# Patient Record
Sex: Female | Born: 1945 | Race: White | Hispanic: No | Smoking: Never smoker
Health system: Southern US, Community
[De-identification: ages and names within clinical notes are randomized; demographics above are authoritative.]

## PROBLEM LIST (undated history)

## (undated) DIAGNOSIS — M1990 Unspecified osteoarthritis, unspecified site: Secondary | ICD-10-CM

## (undated) DIAGNOSIS — F329 Major depressive disorder, single episode, unspecified: Secondary | ICD-10-CM

## (undated) DIAGNOSIS — J45909 Unspecified asthma, uncomplicated: Secondary | ICD-10-CM

## (undated) DIAGNOSIS — E049 Nontoxic goiter, unspecified: Secondary | ICD-10-CM

## (undated) DIAGNOSIS — E785 Hyperlipidemia, unspecified: Secondary | ICD-10-CM

## (undated) DIAGNOSIS — F32A Depression, unspecified: Secondary | ICD-10-CM

## (undated) DIAGNOSIS — I499 Cardiac arrhythmia, unspecified: Secondary | ICD-10-CM

---

## 1989-04-03 HISTORY — PX: BREAST SURGERY: SHX581

## 1998-10-11 ENCOUNTER — Other Ambulatory Visit: Admission: RE | Admit: 1998-10-11 | Discharge: 1998-10-11 | Payer: Self-pay | Admitting: Obstetrics and Gynecology

## 2001-11-29 ENCOUNTER — Other Ambulatory Visit: Admission: RE | Admit: 2001-11-29 | Discharge: 2001-11-29 | Payer: Self-pay | Admitting: Family Medicine

## 2003-05-01 ENCOUNTER — Ambulatory Visit (HOSPITAL_COMMUNITY): Admission: RE | Admit: 2003-05-01 | Discharge: 2003-05-01 | Payer: Self-pay | Admitting: Gastroenterology

## 2004-04-19 ENCOUNTER — Ambulatory Visit: Payer: Self-pay | Admitting: Internal Medicine

## 2004-05-02 ENCOUNTER — Ambulatory Visit: Payer: Self-pay | Admitting: Internal Medicine

## 2004-05-20 ENCOUNTER — Ambulatory Visit: Payer: Self-pay | Admitting: Internal Medicine

## 2004-06-03 ENCOUNTER — Ambulatory Visit: Payer: Self-pay | Admitting: Internal Medicine

## 2004-06-17 ENCOUNTER — Ambulatory Visit: Payer: Self-pay | Admitting: Internal Medicine

## 2004-06-21 ENCOUNTER — Ambulatory Visit: Payer: Self-pay | Admitting: Internal Medicine

## 2004-07-01 ENCOUNTER — Ambulatory Visit: Payer: Self-pay | Admitting: Internal Medicine

## 2004-07-08 ENCOUNTER — Ambulatory Visit: Payer: Self-pay | Admitting: Internal Medicine

## 2004-07-22 ENCOUNTER — Ambulatory Visit: Payer: Self-pay | Admitting: Internal Medicine

## 2004-08-05 ENCOUNTER — Ambulatory Visit: Payer: Self-pay | Admitting: Internal Medicine

## 2004-08-12 ENCOUNTER — Ambulatory Visit: Payer: Self-pay | Admitting: Internal Medicine

## 2004-08-26 ENCOUNTER — Ambulatory Visit: Payer: Self-pay | Admitting: Internal Medicine

## 2004-09-02 ENCOUNTER — Ambulatory Visit: Payer: Self-pay | Admitting: Internal Medicine

## 2004-09-09 ENCOUNTER — Ambulatory Visit: Payer: Self-pay | Admitting: Internal Medicine

## 2004-09-22 ENCOUNTER — Ambulatory Visit: Payer: Self-pay | Admitting: Internal Medicine

## 2004-09-30 ENCOUNTER — Ambulatory Visit: Payer: Self-pay | Admitting: Internal Medicine

## 2004-10-18 ENCOUNTER — Ambulatory Visit: Payer: Self-pay | Admitting: Internal Medicine

## 2004-10-28 ENCOUNTER — Ambulatory Visit: Payer: Self-pay | Admitting: Internal Medicine

## 2004-11-11 ENCOUNTER — Ambulatory Visit: Payer: Self-pay | Admitting: Internal Medicine

## 2004-11-29 ENCOUNTER — Ambulatory Visit: Payer: Self-pay | Admitting: Internal Medicine

## 2004-12-09 ENCOUNTER — Ambulatory Visit: Payer: Self-pay | Admitting: Internal Medicine

## 2004-12-21 ENCOUNTER — Ambulatory Visit: Payer: Self-pay | Admitting: Internal Medicine

## 2005-05-03 ENCOUNTER — Other Ambulatory Visit: Admission: RE | Admit: 2005-05-03 | Discharge: 2005-05-03 | Payer: Self-pay | Admitting: Family Medicine

## 2005-09-07 ENCOUNTER — Ambulatory Visit: Payer: Self-pay | Admitting: Internal Medicine

## 2007-03-11 ENCOUNTER — Ambulatory Visit: Payer: Self-pay | Admitting: Internal Medicine

## 2007-03-11 DIAGNOSIS — J329 Chronic sinusitis, unspecified: Secondary | ICD-10-CM | POA: Insufficient documentation

## 2007-03-11 DIAGNOSIS — J309 Allergic rhinitis, unspecified: Secondary | ICD-10-CM | POA: Insufficient documentation

## 2007-03-11 DIAGNOSIS — J45909 Unspecified asthma, uncomplicated: Secondary | ICD-10-CM | POA: Insufficient documentation

## 2007-03-18 ENCOUNTER — Encounter: Payer: Self-pay | Admitting: Internal Medicine

## 2010-06-09 ENCOUNTER — Other Ambulatory Visit: Payer: Self-pay | Admitting: Family Medicine

## 2010-06-09 DIAGNOSIS — E049 Nontoxic goiter, unspecified: Secondary | ICD-10-CM

## 2010-06-28 ENCOUNTER — Ambulatory Visit
Admission: RE | Admit: 2010-06-28 | Discharge: 2010-06-28 | Disposition: A | Discharge status: Home / Self Care | Payer: BC Managed Care – PPO | Source: Ambulatory Visit | Attending: Family Medicine | Admitting: Family Medicine

## 2010-06-28 DIAGNOSIS — E049 Nontoxic goiter, unspecified: Secondary | ICD-10-CM

## 2010-07-06 ENCOUNTER — Other Ambulatory Visit: Payer: Self-pay | Admitting: Family Medicine

## 2010-07-06 DIAGNOSIS — I6529 Occlusion and stenosis of unspecified carotid artery: Secondary | ICD-10-CM

## 2010-07-12 ENCOUNTER — Ambulatory Visit
Admission: RE | Admit: 2010-07-12 | Discharge: 2010-07-12 | Disposition: A | Discharge status: Home / Self Care | Payer: BC Managed Care – PPO | Source: Ambulatory Visit | Attending: Family Medicine | Admitting: Family Medicine

## 2010-07-12 DIAGNOSIS — I6529 Occlusion and stenosis of unspecified carotid artery: Secondary | ICD-10-CM

## 2010-08-08 ENCOUNTER — Other Ambulatory Visit: Payer: Self-pay | Admitting: Family Medicine

## 2010-08-08 DIAGNOSIS — E042 Nontoxic multinodular goiter: Secondary | ICD-10-CM

## 2010-08-16 ENCOUNTER — Other Ambulatory Visit: Payer: Self-pay | Admitting: Diagnostic Radiology

## 2010-08-16 ENCOUNTER — Other Ambulatory Visit (HOSPITAL_COMMUNITY)
Admission: RE | Admit: 2010-08-16 | Discharge: 2010-08-16 | Disposition: A | Discharge status: Home / Self Care | Payer: BC Managed Care – PPO | Source: Ambulatory Visit | Attending: Diagnostic Radiology | Admitting: Diagnostic Radiology

## 2010-08-16 ENCOUNTER — Ambulatory Visit
Admission: RE | Admit: 2010-08-16 | Discharge: 2010-08-16 | Disposition: A | Discharge status: Home / Self Care | Payer: BC Managed Care – PPO | Source: Ambulatory Visit | Attending: Family Medicine | Admitting: Family Medicine

## 2010-08-16 DIAGNOSIS — E042 Nontoxic multinodular goiter: Secondary | ICD-10-CM

## 2010-08-16 DIAGNOSIS — R220 Localized swelling, mass and lump, head: Secondary | ICD-10-CM | POA: Insufficient documentation

## 2010-08-16 DIAGNOSIS — R221 Localized swelling, mass and lump, neck: Secondary | ICD-10-CM | POA: Insufficient documentation

## 2010-08-19 NOTE — Op Note (Signed)
NAME:  Heather Fitzpatrick, ARIES                     ACCOUNT NO.:  [id number]   MEDICAL RECORD NO.:  [id number]                   PATIENT TYPE:  AMB   LOCATION:  ENDO                                 FACILITY:  [facility]   PHYSICIAN:  Anselmo Rod, M.D.               DATE OF BIRTH:  Jun 26, 1945   DATE OF PROCEDURE:  05/01/2003  DATE OF DISCHARGE:                                 OPERATIVE REPORT   PROCEDURE:  Screening colonoscopy.   ENDOSCOPIST:  Anselmo Rod, M.D.   INSTRUMENT USED:  Olympus video colonoscope.   INDICATIONS FOR PROCEDURE:  A 65 year old white female with a history of  occasional bright red blood per rectum undergoing screening colonoscopy to  rule out colonic polyps, masses, etc.   PREPROCEDURE PREPARATION:  Informed consent was obtained from the patient  and the patient was  fasted for 8 hours prior to  the procedure and prepped  with a bottle of magnesium citrate and a gallon of GoLYTELY the  night prior  to the procedure.   PREPROCEDURE PHYSICAL:  The patient had stable vital signs. Neck supple.  Chest clear to auscultation, S1, S2 regular. Abdomen soft with normoactive  bowel sounds.   DESCRIPTION OF PROCEDURE:  The patient was placed in the left lateral  decubitus position and sedated with 60 mg of Demerol and 6 mg of Versed in  slow incremental doses. Once sedation was adequate, the patient was  maintained on low flow oxygen and continuous cardiac monitoring.   The Olympus video colonoscope was advanced into the rectum and terminal  ileum without difficulty. No masses, polyps, erosions or ulcerations or  diverticula were seen. A small internal hemorrhoid was recognized on  retroflexion in the rectum. The patient tolerated the procedure well without  complications.   IMPRESSION:  Normal colonoscopy to the terminal ileum except for small  internal hemorrhoids.   RECOMMENDATIONS:  1. Continue a high fiber diet with liberal fluid intake.  2. Repeat  colorectal cancer screening in the next 10 years unless the     patient develops any abnormal symptoms in the interim.  3. Outpatient follow up as the need arises in the future.                                               Anselmo Rod, M.D.    JNM/MEDQ  D:  05/01/2003  T:  05/01/2003  Job:  [id number]   cc:   Talmadge Coventry, M.D.  [address]  [place]  [place] [address]  Fax: [phone]

## 2011-01-23 ENCOUNTER — Other Ambulatory Visit: Payer: Self-pay | Admitting: Obstetrics and Gynecology

## 2011-10-10 ENCOUNTER — Other Ambulatory Visit: Payer: Self-pay | Admitting: Endocrinology

## 2011-10-10 DIAGNOSIS — E049 Nontoxic goiter, unspecified: Secondary | ICD-10-CM

## 2011-10-23 ENCOUNTER — Other Ambulatory Visit: Payer: BC Managed Care – PPO

## 2011-10-29 IMAGING — US US THYROID BIOPSY
1 series · 13 of 19 positions shown · non-contrast
Comparison: Ultrasound from 06/28/2010

CLINICAL DATA: 64-year-old with prominent isthmus nodule.

ULTRASOUND GUIDED NEEDLE ASPIRATE BIOPSY OF THE THYROID GLAND x2

[Series 1: us thyroid biopsy · 0.07mm/px · 19 acquisitions, 13 frames shown]
[im 1/19]
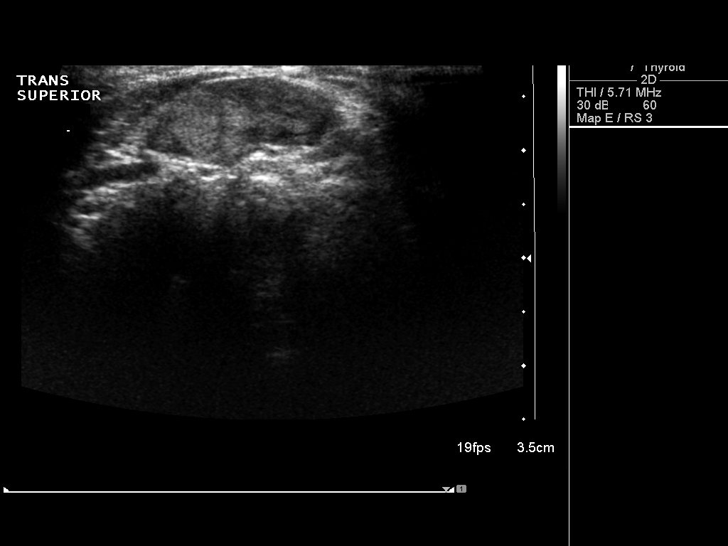
[im 3/19]
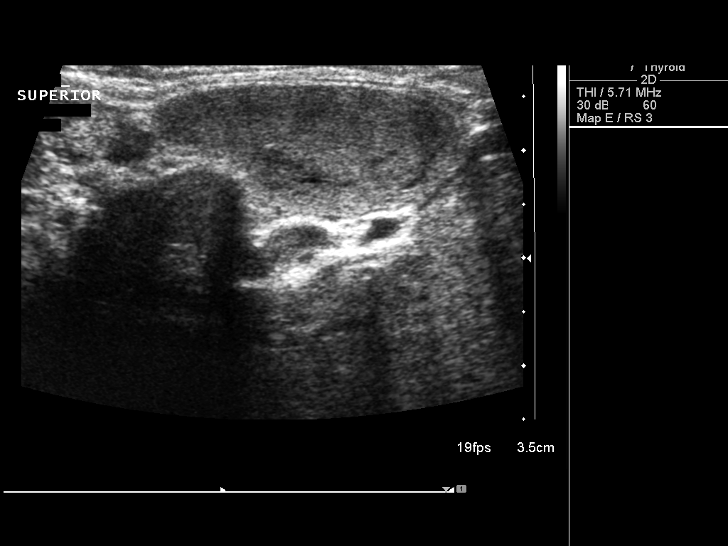
[im 4/19]
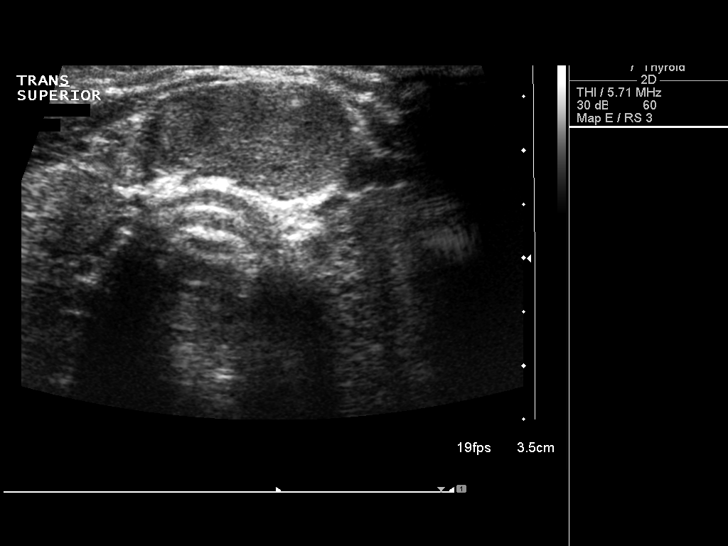
[im 6/19]
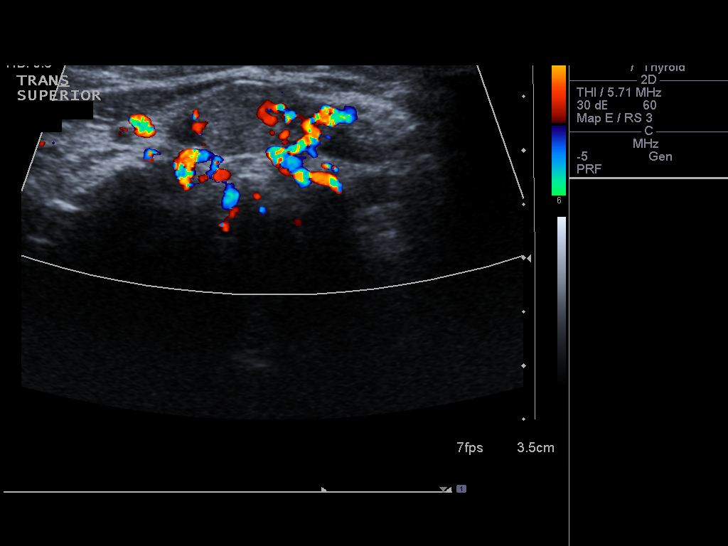
[im 7/19]
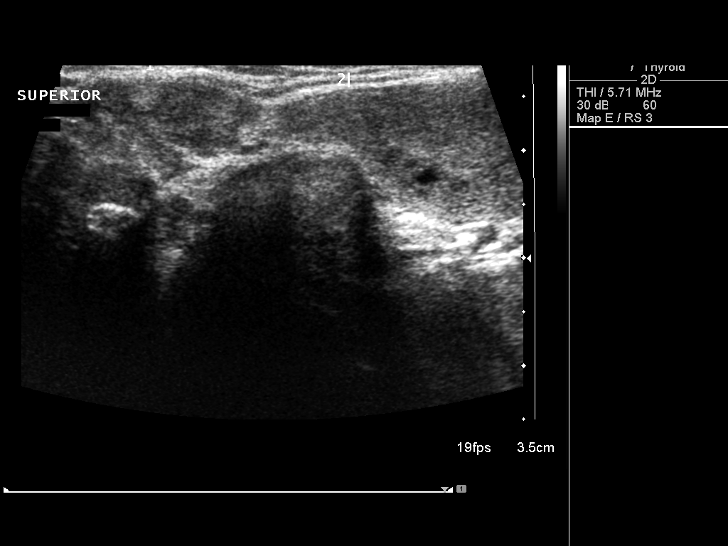
[im 9/19]
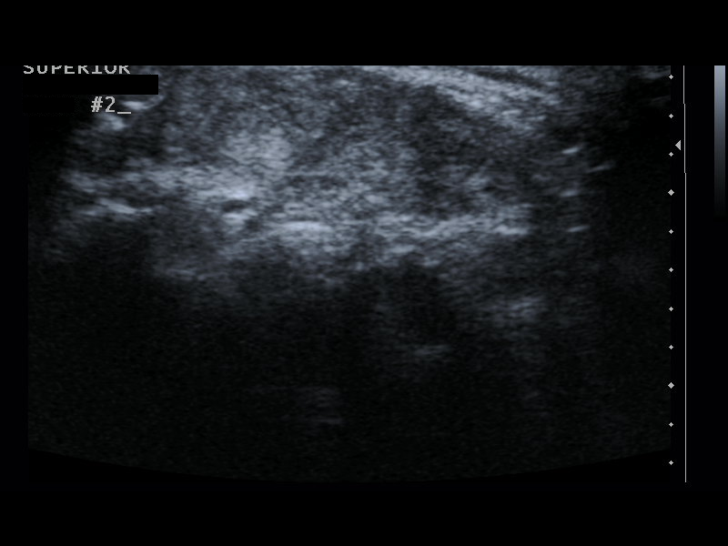
[im 10/19]
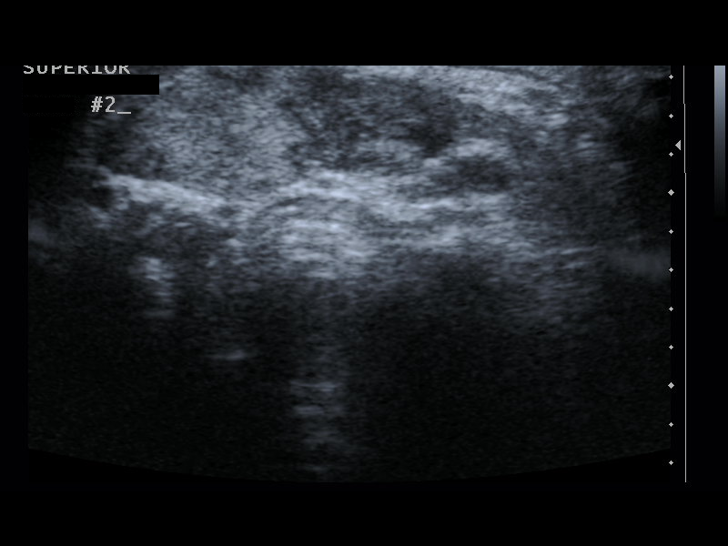
[im 11/19]
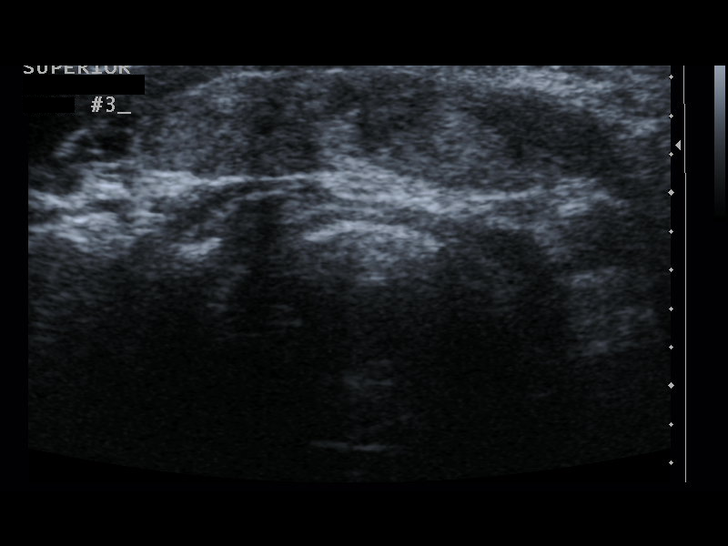
[im 13/19]
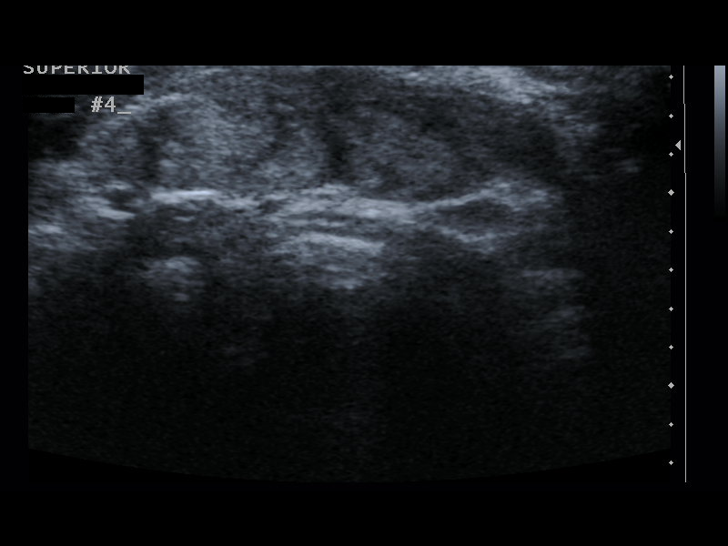
[im 14/19]
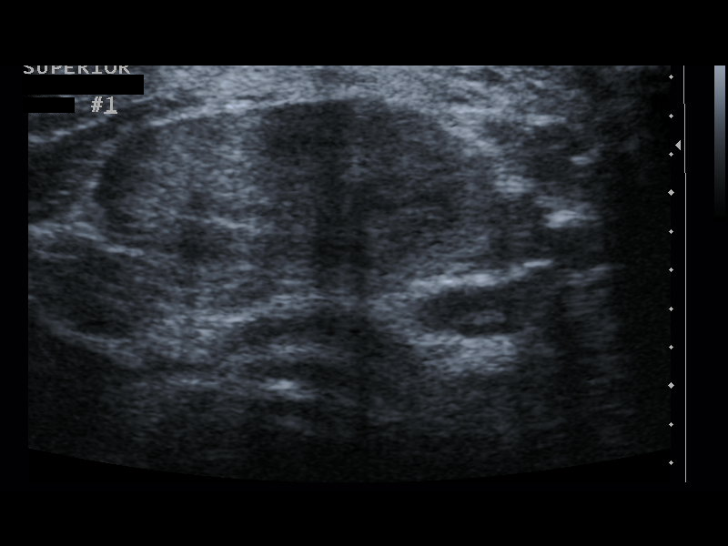
[im 16/19]
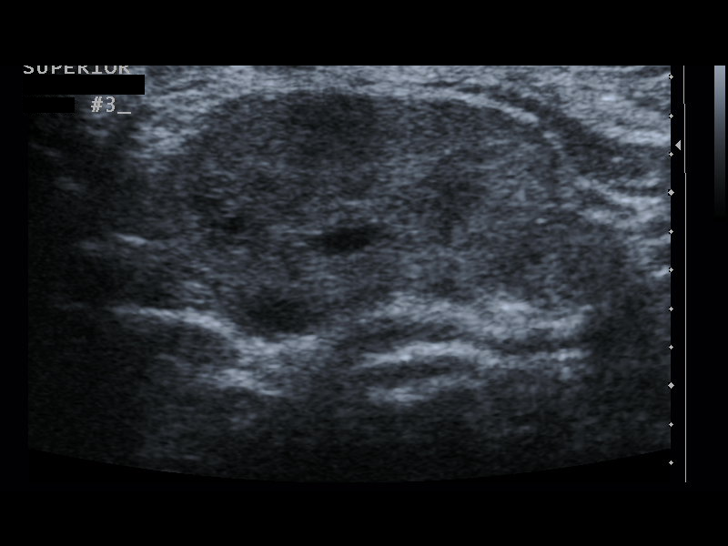
[im 17/19]
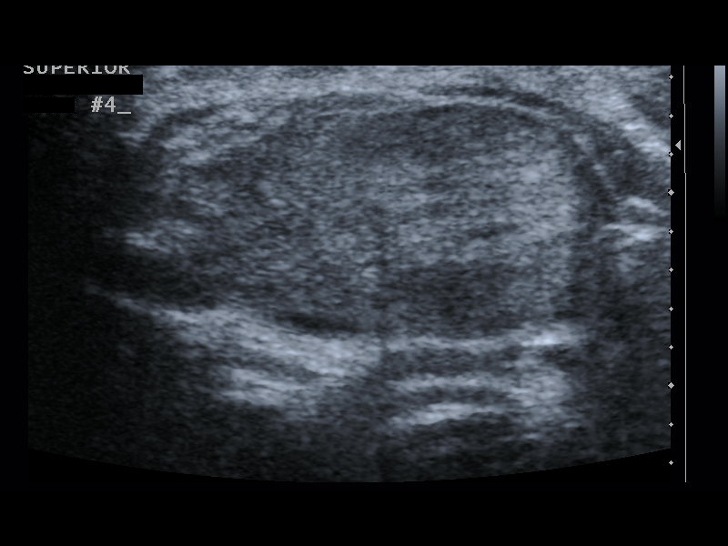
[im 19/19]
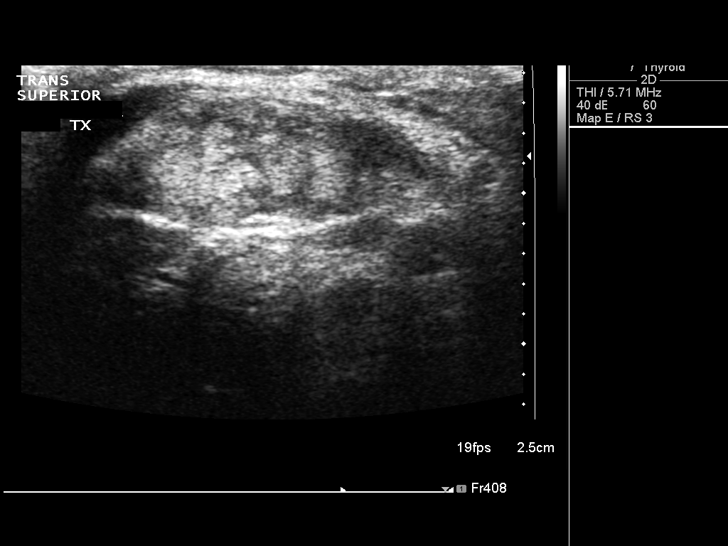

[13 of 19 positions shown; findings below may reference images not displayed]

Thyroid biopsy was thoroughly discussed with the patient and
questions were answered.  The benefits, risks, alternatives, and
complications were also discussed.  The patient understands and
wishes to proceed with the procedure.  Written consent was
obtained.

Ultrasound was performed to localize and mark an adequate site for
the biopsy.  The patient was then prepped and draped in a normal
sterile fashion.  Local anesthesia was provided with 1% lidocaine.
Using direct ultrasound guidance, four passes were made using 25
gauge needles into the nodule within the superior isthmus lobe of
the thyroid.  Ultrasound was used to confirm needle placements on
all occasions.  Specimens were sent to Pathology for analysis.

Using direct ultrasound guidance, four passes were made using 25
gauge needles into the nodule within the isthmus lobe of the
thyroid.  Ultrasound was used to confirm needle placements on all
occasions.  Specimens were sent to Pathology for analysis.

Complications:  None
FINDINGS: The patient has prominent thyroid tissue above the
isthmus.  These islands of tissue may be connected with the
isthmus.  The most superior lesion is smaller and and slightly more
heterogeneous.
IMPRESSION: Ultrasound guided needle aspirate biopsy performed of the two
lesions along the superior aspect of the isthmus.

## 2011-10-30 ENCOUNTER — Other Ambulatory Visit: Payer: BC Managed Care – PPO

## 2011-11-06 ENCOUNTER — Ambulatory Visit
Admission: RE | Admit: 2011-11-06 | Discharge: 2011-11-06 | Disposition: A | Discharge status: Home / Self Care | Payer: BC Managed Care – PPO | Source: Ambulatory Visit | Attending: Endocrinology | Admitting: Endocrinology

## 2011-11-06 DIAGNOSIS — E049 Nontoxic goiter, unspecified: Secondary | ICD-10-CM

## 2013-01-18 IMAGING — US US SOFT TISSUE HEAD/NECK
1 series · 13 of 25 positions shown · non-contrast
Comparison: 06/28/2010 study.

CLINICAL DATA: History of goiter.  History of nodules in the
isthmus.

THYROID ULTRASOUND
TECHNIQUE: Ultrasound examination of the thyroid gland and adjacent
soft tissues was performed.

[Series 1: us soft tissue head/neck · 0.10mm/px · 13 of 39 slices shown]
[im 1/39]
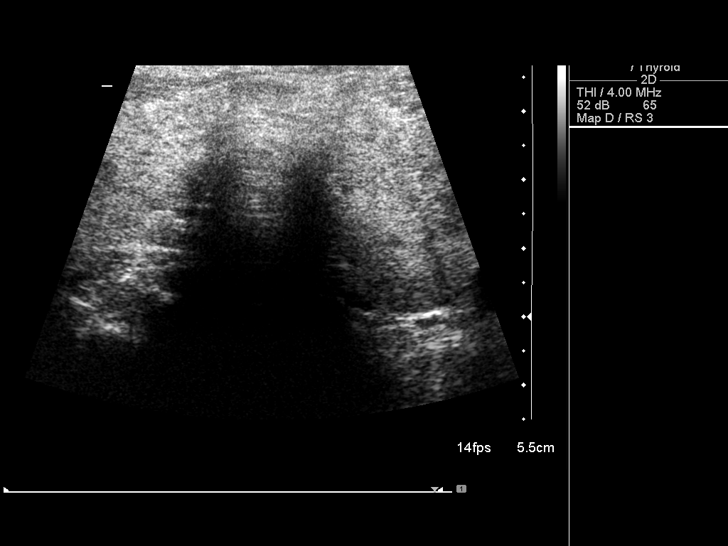
[im 4/39]
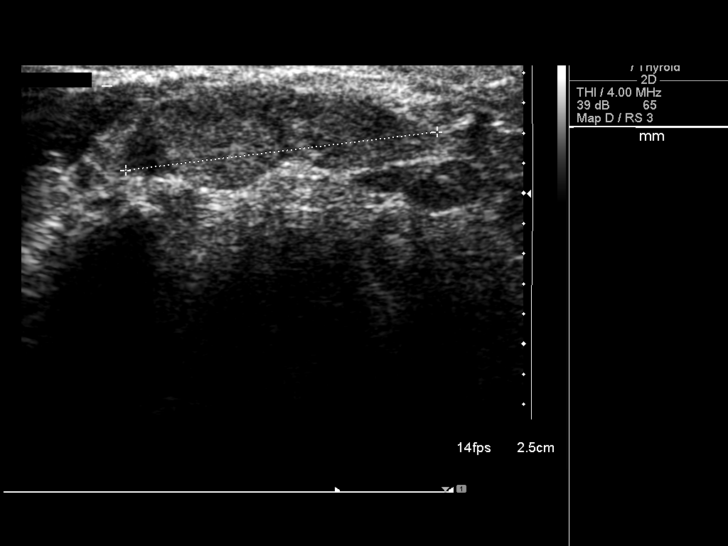
[im 7/39]
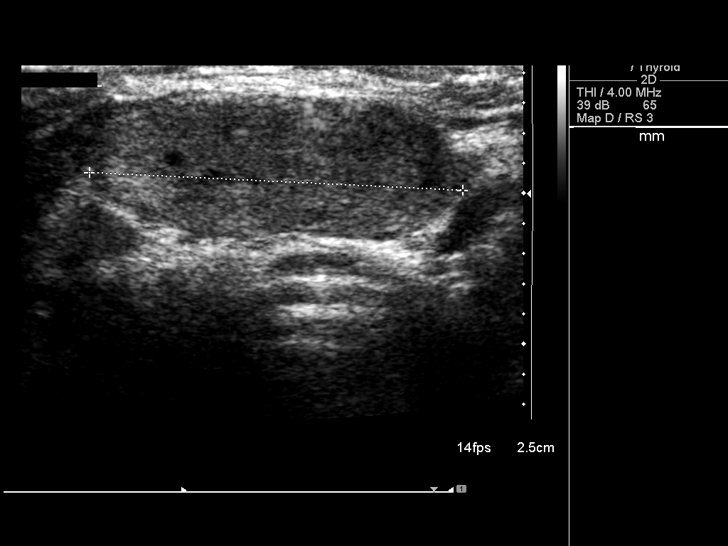
[im 10/39]
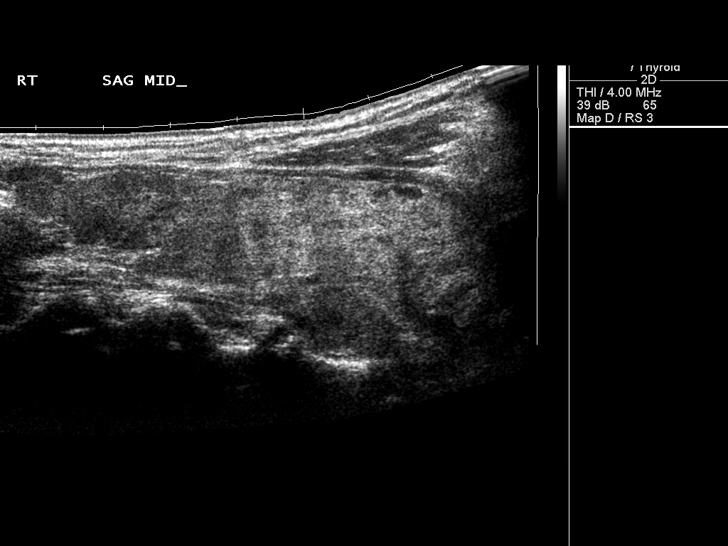
[im 13/39]
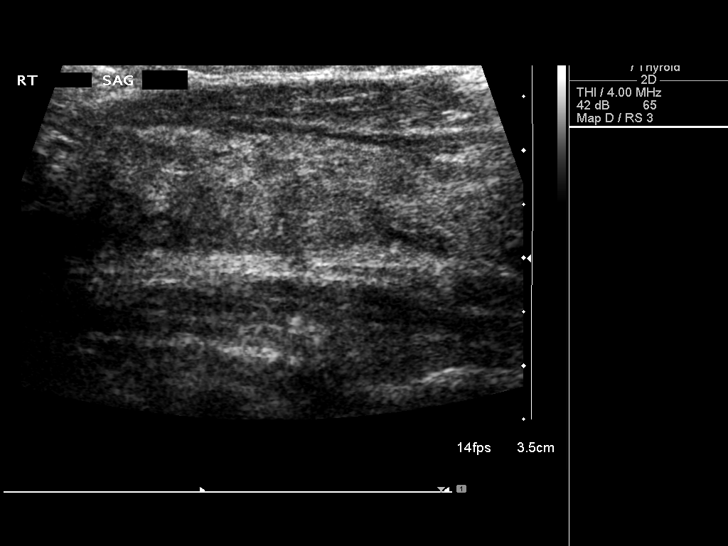
[im 16/39]
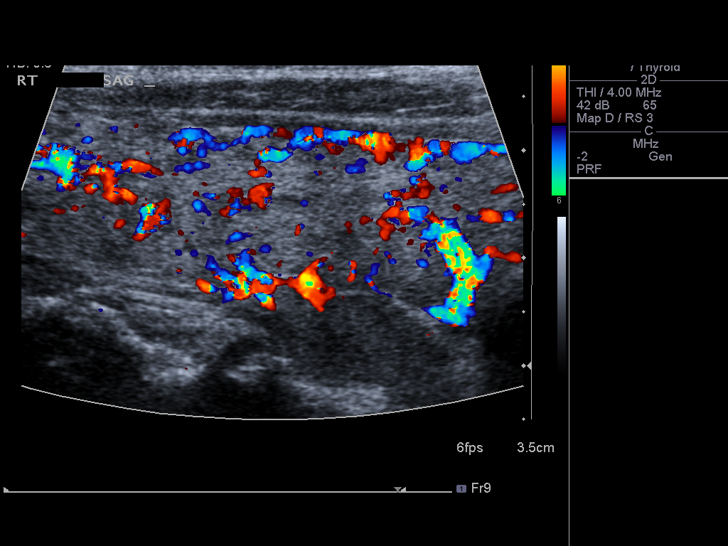
[im 20/39]
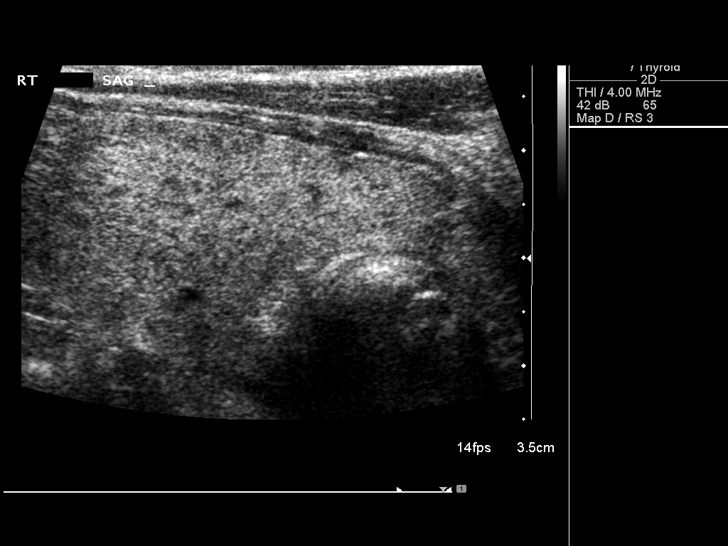
[im 23/39]
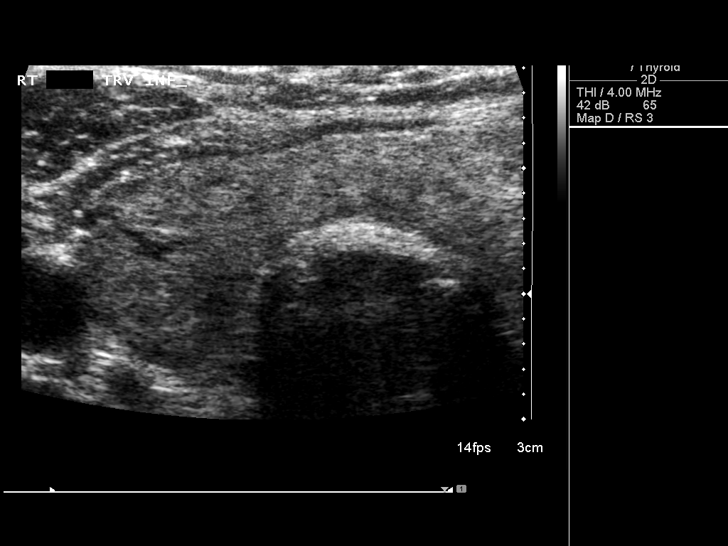
[im 26/39]
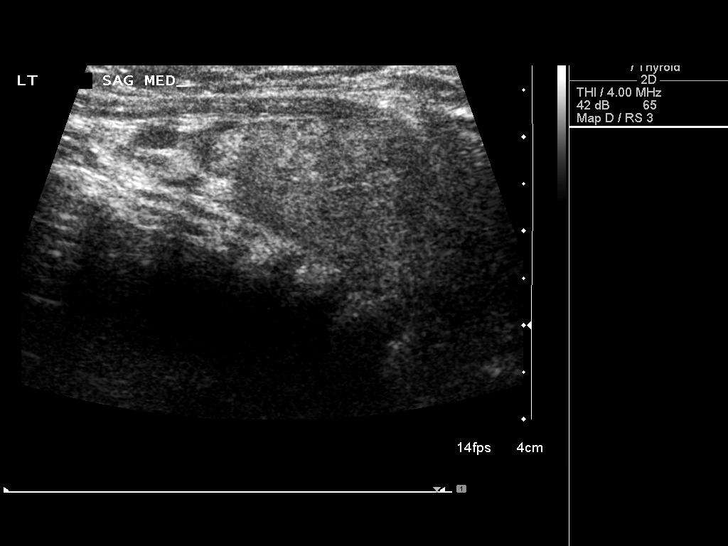
[im 29/39]
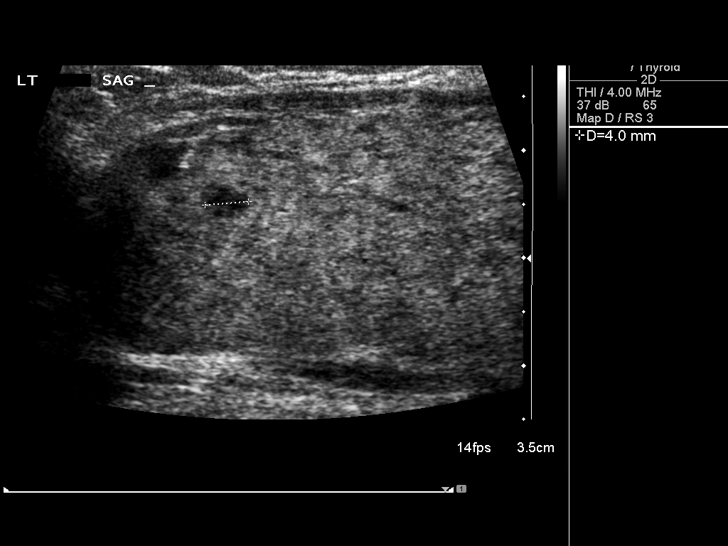
[im 32/39]
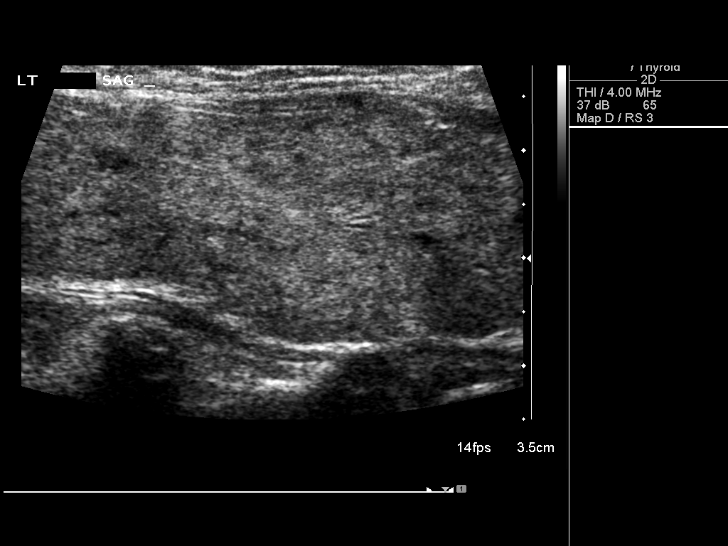
[im 35/39]
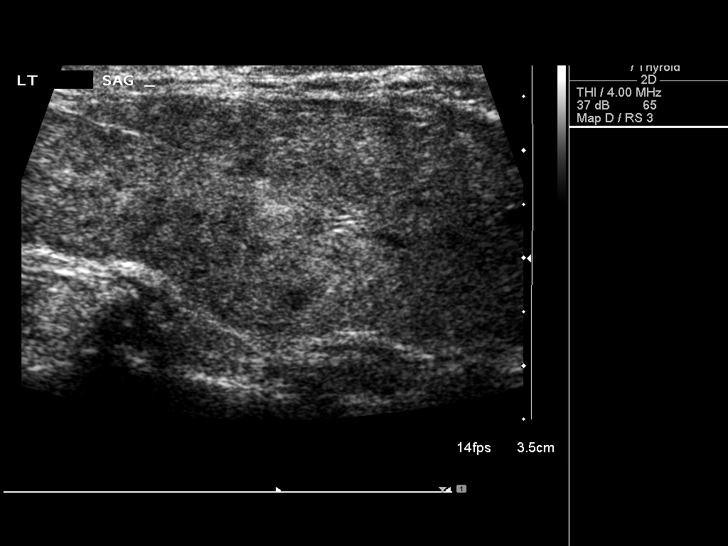
[im 39/39]
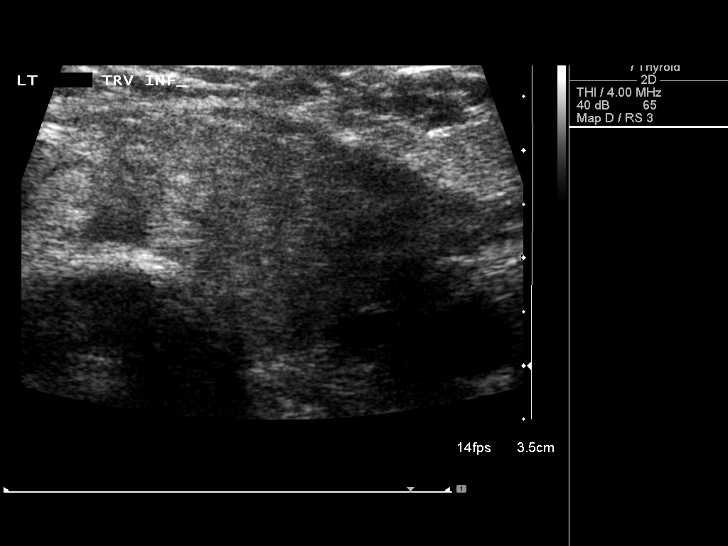

[13 of 25 positions shown; findings below may reference images not displayed]

FINDINGS: Right thyroid lobe:  Right lobe of the thyroid measured 7.7 x 2.9 x
2.3 cm. Volume is 25.7 ml. Parenchyma is inhomogeneous. On previous
study right lobe measured 7.8 x 2.8 x 2.3 cm.  On previous study
volume was 25.1 ml. This is not significantly changed.

Left thyroid lobe:  The left lobe of the thyroid measures 6.8 x
x 2.8 cm. Volume is 25.7 ml. Parenchyma is inhomogeneous. On
previous study left lobe of thyroid measures 6.8 x 2.6 x 2.9 cm. On
previous study volume was 25.6 ml. This is not significantly
changed.

Isthmus:  AP diameter of the isthmus is 1.0 cm. Parenchyma is
inhomogeneous. On previous study AP diameter of the isthmus was
cm.  This is not significantly changed.

Focal nodules:  Nodular area in superior aspect of the isthmus
measured 2.1 x 1.9 x 0.8 cm. Volume is 1.6 ml. On previous study
this area measured 1.7 x 0.9 x 1.7 cm. Volume is 1.3 ml. The nodule
is slightly larger on the current study..

Nodular area in the inferior aspect of the isthmus measures 2.7 x
1.1 x 2.5 cm. Volume measures 3.7 ml.  On previous study this area
measured 2.7 x 1.0 x 2.1 cm. Volume is 2.8 ml.  The nodule is
slightly larger.

Hypoechoic area within the upper midportion of left lobe of thyroid
measured 4 mm in diameter. This probably reflects a small cyst with
a few internal echoes.

Hypoechoic areain the midportion of the left lobe of the thyroid
measured 4 mm in greatest diameter.  This is felt to represent a
small cyst.

Lymphadenopathy:  None visualized.
IMPRESSION: There is a multinodular goiter.  The overall size of the thyroid
gland appears stable. There are two solid nodular areas present
within the isthmus. They are each very slightly larger than on the
previous study.  Previous biopsy has been performed.  Two small
left lobe cysts are present.

## 2014-02-12 ENCOUNTER — Ambulatory Visit (INDEPENDENT_AMBULATORY_CARE_PROVIDER_SITE_OTHER): Payer: 59 | Admitting: Psychology

## 2014-02-12 DIAGNOSIS — F4323 Adjustment disorder with mixed anxiety and depressed mood: Secondary | ICD-10-CM

## 2014-03-05 ENCOUNTER — Ambulatory Visit (INDEPENDENT_AMBULATORY_CARE_PROVIDER_SITE_OTHER): Payer: 59 | Admitting: Psychology

## 2014-03-05 DIAGNOSIS — F4323 Adjustment disorder with mixed anxiety and depressed mood: Secondary | ICD-10-CM

## 2014-03-19 ENCOUNTER — Ambulatory Visit (INDEPENDENT_AMBULATORY_CARE_PROVIDER_SITE_OTHER): Payer: 59 | Admitting: Psychology

## 2014-03-19 DIAGNOSIS — F4323 Adjustment disorder with mixed anxiety and depressed mood: Secondary | ICD-10-CM

## 2014-04-16 ENCOUNTER — Ambulatory Visit: Payer: 59 | Admitting: Psychology

## 2014-05-07 ENCOUNTER — Ambulatory Visit (INDEPENDENT_AMBULATORY_CARE_PROVIDER_SITE_OTHER): Payer: 59 | Admitting: Psychology

## 2014-05-07 DIAGNOSIS — F4323 Adjustment disorder with mixed anxiety and depressed mood: Secondary | ICD-10-CM

## 2014-06-02 ENCOUNTER — Ambulatory Visit (INDEPENDENT_AMBULATORY_CARE_PROVIDER_SITE_OTHER): Payer: 59 | Admitting: Psychology

## 2014-06-02 DIAGNOSIS — F4323 Adjustment disorder with mixed anxiety and depressed mood: Secondary | ICD-10-CM

## 2014-06-18 ENCOUNTER — Ambulatory Visit: Payer: 59 | Admitting: Psychology

## 2016-12-01 ENCOUNTER — Ambulatory Visit: Payer: Self-pay | Admitting: Orthopedic Surgery

## 2016-12-27 NOTE — H&P (Signed)
TOTAL KNEE ADMISSION H&P  Patient is being admitted for left total knee arthroplasty.  Subjective:  Chief Complaint:left knee pain.  HPI: Heather Fitzpatrick, 71 y.o. female, has a history of pain and functional disability in the left knee due to arthritis and has failed non-surgical conservative treatments for greater than 12 weeks to includeNSAID's and/or analgesics, corticosteriod injections, viscosupplementation injections, weight reduction as appropriate and activity modification.  Onset of symptoms was gradual, starting 3 years ago with gradually worsening course since that time. The patient noted no past surgery on the left knee(s).  Patient currently rates pain in the left knee(s) at 7 out of 10 with activity. Patient has worsening of pain with activity and weight bearing, pain that interferes with activities of daily living and pain with passive range of motion.  Patient has evidence of subchondral sclerosis, joint subluxation and joint space narrowing by imaging studies. 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 There is no active infection.  Patient Active Problem List   Diagnosis Date Noted  . SINUSITIS 03/11/2007  . ALLERGIC RHINITIS 03/11/2007  . ASTHMA 03/11/2007   No past medical history on file.  No past surgical history on file.  No prescriptions prior to admission.   Allergies not on file  Social History  Substance Use Topics  . Smoking status: Not on file  . Smokeless tobacco: Not on file  . Alcohol use Not on file    No family history on file.   Review of Systems  Constitutional: Negative.   HENT: Negative.   Eyes: Negative.   Respiratory: Negative.   Cardiovascular: Negative.   Gastrointestinal: Negative.   Genitourinary: Negative.   Musculoskeletal: Positive for joint pain.  Skin: Negative.   Neurological: Negative.   Endo/Heme/Allergies: Negative.   Psychiatric/Behavioral: Negative.     Objective:  Physical  Exam  Constitutional: She is oriented to person, place, and time. She appears well-developed.  HENT:  Right Ear: External ear normal.  Eyes: EOM are normal.  Neck: Normal range of motion.  Cardiovascular: Intact distal pulses.   Respiratory: Effort normal and breath sounds normal.  GI: Soft.  Genitourinary:  Genitourinary Comments: deferred  Musculoskeletal: Normal range of motion.  Neurological: She is alert and oriented to person, place, and time.  Skin: Skin is warm and dry.  Psychiatric: Her behavior is normal.    Vital signs in last 24 hours: BP: ()/()  Arterial Line BP: ()/()   Labs:   There is no height or weight on file to calculate BMI.   Imaging Review Plain radiographs demonstrate moderate degenerative joint disease of the left knee(s). The overall alignment ismild varus. The bone quality appears to be good for age and reported activity level.  Assessment/Plan:  End stage arthritis, left knee   The patient history, physical examination, clinical judgment of the provider and imaging studies are consistent with end stage degenerative joint disease of the left knee(s) and total knee arthroplasty is deemed medically necessary. The treatment options including medical management, injection therapy arthroscopy and arthroplasty were discussed at length. The risks and benefits of total knee arthroplasty were presented and reviewed. The risks due to aseptic loosening, infection, stiffness, patella tracking problems, thromboembolic complications and other imponderables were discussed. The patient acknowledged the explanation, agreed to proceed with the plan and consent was signed. Patient is being admitted for inpatient treatment for surgery, pain control, PT, OT, prophylactic antibiotics, VTE prophylaxis, progressive ambulation and ADL's and discharge planning. The  patient is planning to be discharged to skilled nursing facility   Will use IV tranexamic acid. Contraindications  and adverse affects of Tranexamic acid discussed in detail. Patient denies any of these at this time and understands the risks and benefits.

## 2017-01-10 ENCOUNTER — Other Ambulatory Visit (HOSPITAL_COMMUNITY): Payer: Self-pay | Admitting: Emergency Medicine

## 2017-01-10 NOTE — Patient Instructions (Signed)
Heather Fitzpatrick  01/10/2017   YourGRISELA MESCHheduled on: 01-19-17  Report to [facility] Main  Entrance Take Effort  elevators to 3rd floor to  Short Stay Center at 1:15PM.    Call this number if you have problems the Fitzpatrick of surgery  [phone]   Remember: ONLY 1 PERSON MAY GO WITH YOU TO SHORT STAY TO GET  READY Fitzpatrick OF YOUR SURGERY.  Do not eat food After Midnight. You may have clear liquids from midnight until 9:45AM day of surgery. nothing by mouth after 9:45AM!     Take these medicines the Fitzpatrick of surgery with A SIP OF WATER: tylenol as needed, inhaler as needed                                You may not have any metal on your body including hair pins and              piercings  Do not wear jewelry, make-up, lotions, powders or perfumes, deodorant             Do not wear nail polish.  Do not shave  48 hours prior to surgery.               Do not bring valuables to the hospital. [facility] IS NOT             RESPONSIBLE   FOR VALUABLES.  Contacts, dentures or bridgework may not be worn into surgery.  Leave suitcase in the car. After surgery it may be brought to your room.                 Please read over the following fact sheets you were given: _____________________________________________________________________   CLEAR LIQUID DIET   Foods Allowed                                                                     Foods Excluded  Coffee and tea, regular and decaf                             liquids that you cannot  Plain Jell-O in any flavor                                             see through such as: Fruit ices (not with fruit pulp)                                     milk, soups, orange juice  Iced Popsicles                                    All solid food Carbonated beverages, regular and diet  Cranberry, grape and apple juices Sports drinks like Gatorade Lightly seasoned clear  broth or consume(fat free) Sugar, honey syrup  Sample Menu Breakfast                                Lunch                                     Supper Cranberry juice                    Beef broth                            Chicken broth Jell-O                                     Grape juice                           Apple juice Coffee or tea                        Jell-O                                      Popsicle                                                Coffee or tea                        Coffee or tea  _____________________________________________________________________  [facility] - Preparing for Surgery Before surgery, you can play an important role.  Because skin is not sterile, your skin needs to be as free of germs as possible.  You can reduce the number of germs on your skin by washing with CHG (chlorahexidine gluconate) soap before surgery.  CHG is an antiseptic cleaner which kills germs and bonds with the skin to continue killing germs even after washing. Please DO NOT use if you have an allergy to CHG or antibacterial soaps.  If your skin becomes reddened/irritated stop using the CHG and inform your nurse when you arrive at Short Stay. Do not shave (including legs and underarms) for at least 48 hours prior to the first CHG shower.  You may shave your face/neck. Please follow these instructions carefully:  1.  Shower with CHG Soap the night before surgery and the  Fitzpatrick of Surgery.  2.  If you choose to wash your hair, wash your hair first as usual with your  normal  shampoo.  3.  After you shampoo, rinse your hair and body thoroughly to remove the  shampoo.                           4.  Use CHG as you would any other liquid soap.  You can apply chg directly  to the skin and wash  Gently with a scrungie or clean washcloth.  5.  Apply the CHG Soap to your body ONLY FROM THE NECK DOWN.   Do not use on face/ open                           Wound or open  sores. Avoid contact with eyes, ears mouth and genitals (private parts).                       Wash face,  Genitals (private parts) with your normal soap.             6.  Wash thoroughly, paying special attention to the area where your surgery  will be performed.  7.  Thoroughly rinse your body with warm water from the neck down.  8.  DO NOT shower/wash with your normal soap after using and rinsing off  the CHG Soap.                9.  Pat yourself dry with a clean towel.            10.  Wear clean pajamas.            11.  Place clean sheets on your bed the night of your first shower and do not  sleep with pets. Day of Surgery : Do not apply any lotions/deodorants the Fitzpatrick of surgery.  Please wear clean clothes to the hospital/surgery center.  FAILURE TO FOLLOW THESE INSTRUCTIONS MAY RESULT IN THE CANCELLATION OF YOUR SURGERY PATIENT SIGNATURE_________________________________  NURSE SIGNATURE__________________________________  ________________________________________________________________________   Adam Phenix  An incentive spirometer is a tool that can help keep your lungs clear and active. This tool measures how well you are filling your lungs with each breath. Taking long deep breaths may help reverse or decrease the chance of developing breathing (pulmonary) problems (especially infection) following:  A long period of time when you are unable to move or be active. BEFORE THE PROCEDURE   If the spirometer includes an indicator to show your best effort, your nurse or respiratory therapist will set it to a desired goal.  If possible, sit up straight or lean slightly forward. Try not to slouch.  Hold the incentive spirometer in an upright position. INSTRUCTIONS FOR USE  1. Sit on the edge of your bed if possible, or sit up as far as you can in bed or on a chair. 2. Hold the incentive spirometer in an upright position. 3. Breathe out normally. 4. Place the mouthpiece  in your mouth and seal your lips tightly around it. 5. Breathe in slowly and as deeply as possible, raising the piston or the ball toward the top of the column. 6. Hold your breath for 3-5 seconds or for as long as possible. Allow the piston or ball to fall to the bottom of the column. 7. Remove the mouthpiece from your mouth and breathe out normally. 8. Rest for a few seconds and repeat Steps 1 through 7 at least 10 times every 1-2 hours when you are awake. Take your time and take a few normal breaths between deep breaths. 9. The spirometer may include an indicator to show your best effort. Use the indicator as a goal to work toward during each repetition. 10. After each set of 10 deep breaths, practice coughing to be sure your lungs are clear. If you have an incision (the cut made at the time of  surgery), support your incision when coughing by placing a pillow or rolled up towels firmly against it. Once you are able to get out of bed, walk around indoors and cough well. You may stop using the incentive spirometer when instructed by your caregiver.  RISKS AND COMPLICATIONS  Take your time so you do not get dizzy or light-headed.  If you are in pain, you may need to take or ask for pain medication before doing incentive spirometry. It is harder to take a deep breath if you are having pain. AFTER USE  Rest and breathe slowly and easily.  It can be helpful to keep track of a log of your progress. Your caregiver can provide you with a simple table to help with this. If you are using the spirometer at home, follow these instructions: [place] IF:   You are having difficultly using the spirometer.  You have trouble using the spirometer as often as instructed.  Your pain medication is not giving enough relief while using the spirometer.  You develop fever of 100.5 F (38.1 C) or higher. SEEK IMMEDIATE MEDICAL CARE IF:   You cough up bloody sputum that had not been present  before.  You develop fever of 102 F (38.9 C) or greater.  You develop worsening pain at or near the incision site. MAKE SURE YOU:   Understand these instructions.  Will watch your condition.  Will get help right away if you are not doing well or get worse. Document Released: 07/31/2006 Document Revised: 06/12/2011 Document Reviewed: 10/01/2006 [facility] Patient Information 2014 [place], [place].   ________________________________________________________________________

## 2017-01-10 NOTE — Progress Notes (Signed)
LOV/Cardiac clearance Dr Viann Fish 10-17-16 on chart   LOV/Clearance Dr Laurann Montana 10-05-16 on chart   EKG 10-05-16 on chart

## 2017-01-15 ENCOUNTER — Encounter (HOSPITAL_COMMUNITY): Payer: Self-pay

## 2017-01-15 ENCOUNTER — Encounter (INDEPENDENT_AMBULATORY_CARE_PROVIDER_SITE_OTHER): Payer: Self-pay

## 2017-01-15 ENCOUNTER — Encounter (HOSPITAL_COMMUNITY)
Admission: RE | Admit: 2017-01-15 | Discharge: 2017-01-15 | Disposition: A | Discharge status: Home / Self Care | Payer: Medicare Other | Source: Ambulatory Visit | Attending: Specialist | Admitting: Specialist

## 2017-01-15 DIAGNOSIS — M25562 Pain in left knee: Secondary | ICD-10-CM | POA: Insufficient documentation

## 2017-01-15 DIAGNOSIS — Z01812 Encounter for preprocedural laboratory examination: Secondary | ICD-10-CM | POA: Diagnosis present

## 2017-01-15 DIAGNOSIS — J45909 Unspecified asthma, uncomplicated: Secondary | ICD-10-CM | POA: Diagnosis not present

## 2017-01-15 DIAGNOSIS — M1712 Unilateral primary osteoarthritis, left knee: Secondary | ICD-10-CM | POA: Diagnosis not present

## 2017-01-15 DIAGNOSIS — Z791 Long term (current) use of non-steroidal anti-inflammatories (NSAID): Secondary | ICD-10-CM | POA: Diagnosis not present

## 2017-01-15 DIAGNOSIS — Z8781 Personal history of (healed) traumatic fracture: Secondary | ICD-10-CM | POA: Diagnosis not present

## 2017-01-15 HISTORY — DX: Hyperlipidemia, unspecified: E78.5

## 2017-01-15 HISTORY — DX: Unspecified asthma, uncomplicated: J45.909

## 2017-01-15 HISTORY — DX: Depression, unspecified: F32.A

## 2017-01-15 HISTORY — DX: Nontoxic goiter, unspecified: E04.9

## 2017-01-15 HISTORY — DX: Unspecified osteoarthritis, unspecified site: M19.90

## 2017-01-15 HISTORY — DX: Major depressive disorder, single episode, unspecified: F32.9

## 2017-01-15 HISTORY — DX: Cardiac arrhythmia, unspecified: I49.9

## 2017-01-15 LAB — BASIC METABOLIC PANEL
Anion gap: 8 (ref 5–15)
BUN: 17 mg/dL (ref 6–20)
CHLORIDE: 104 mmol/L (ref 101–111)
CO2: 28 mmol/L (ref 22–32)
Calcium: 10 mg/dL (ref 8.9–10.3)
Creatinine, Ser: 0.81 mg/dL (ref 0.44–1.00)
GFR calc Af Amer: >60 mL/min (ref >60)
GFR calc non Af Amer: >60 mL/min (ref >60)
Glucose, Bld: 88 mg/dL (ref 65–99)
POTASSIUM: 4.3 mmol/L (ref 3.5–5.1)
SODIUM: 140 mmol/L (ref 135–145)

## 2017-01-15 LAB — URINALYSIS, ROUTINE W REFLEX MICROSCOPIC
BILIRUBIN URINE: NEGATIVE
Glucose, UA: NEGATIVE mg/dL
HGB URINE DIPSTICK: NEGATIVE
KETONES UR: NEGATIVE mg/dL
Leukocytes, UA: NEGATIVE
Nitrite: NEGATIVE
PROTEIN: NEGATIVE mg/dL
Specific Gravity, Urine: 1.008 (ref 1.005–1.030)
pH: 7 (ref 5.0–8.0)

## 2017-01-15 LAB — CBC
HEMATOCRIT: 39.6 % (ref 36.0–46.0)
Hemoglobin: 13.1 g/dL (ref 12.0–15.0)
MCH: 29 pg (ref 26.0–34.0)
MCHC: 33.1 g/dL (ref 30.0–36.0)
MCV: 87.6 fL (ref 78.0–100.0)
Platelets: 252 10*3/uL (ref 150–400)
RBC: 4.52 MIL/uL (ref 3.87–5.11)
RDW: 14.8 % (ref 11.5–15.5)
WBC: 4.7 10*3/uL (ref 4.0–10.5)

## 2017-01-15 LAB — SURGICAL PCR SCREEN
MRSA, PCR: NEGATIVE
Staphylococcus aureus: NEGATIVE

## 2017-01-15 LAB — PROTIME-INR
INR: 0.95
Prothrombin Time: 12.6 seconds (ref 11.4–15.2)

## 2017-01-15 LAB — APTT: aPTT: 34 seconds (ref 24–36)

## 2017-01-15 LAB — ABO/RH: ABO/RH(D): O POS

## 2017-01-19 ENCOUNTER — Inpatient Hospital Stay (HOSPITAL_COMMUNITY): Payer: Medicare Other | Admitting: Anesthesiology

## 2017-01-19 ENCOUNTER — Encounter (HOSPITAL_COMMUNITY)
Admission: RE | Disposition: A | Discharge status: Home Health | Payer: Self-pay | Source: Ambulatory Visit | Attending: Specialist

## 2017-01-19 ENCOUNTER — Encounter (HOSPITAL_COMMUNITY): Payer: Self-pay | Admitting: Anesthesiology

## 2017-01-19 ENCOUNTER — Inpatient Hospital Stay (HOSPITAL_COMMUNITY)
Admission: RE | Admit: 2017-01-19 | Discharge: 2017-01-21 | DRG: 470 | Disposition: A | Discharge status: Home Health | Payer: Medicare Other | Source: Ambulatory Visit | Attending: Specialist | Admitting: Specialist

## 2017-01-19 DIAGNOSIS — J45909 Unspecified asthma, uncomplicated: Secondary | ICD-10-CM | POA: Diagnosis present

## 2017-01-19 DIAGNOSIS — M1712 Unilateral primary osteoarthritis, left knee: Secondary | ICD-10-CM | POA: Diagnosis present

## 2017-01-19 DIAGNOSIS — Z96659 Presence of unspecified artificial knee joint: Secondary | ICD-10-CM

## 2017-01-19 HISTORY — PX: TOTAL KNEE ARTHROPLASTY: SHX125

## 2017-01-19 LAB — TYPE AND SCREEN
ABO/RH(D): O POS
ANTIBODY SCREEN: NEGATIVE

## 2017-01-19 SURGERY — ARTHROPLASTY, KNEE, TOTAL
Anesthesia: Spinal | Site: Knee | Laterality: Left

## 2017-01-19 MED ORDER — ONDANSETRON HCL 4 MG/2ML IJ SOLN: 4.0000 mg | Freq: Four times a day (QID) | INTRAMUSCULAR | Status: DC | PRN

## 2017-01-19 MED ORDER — BUPIVACAINE-EPINEPHRINE (PF) 0.25% -1:200000 IJ SOLN
INTRAMUSCULAR | Status: AC
  Filled 2017-01-19: qty 30

## 2017-01-19 MED ORDER — PROPOFOL 10 MG/ML IV BOLUS
INTRAVENOUS | Status: DC | PRN
  Administered 2017-01-19 (×3): 20 mg via INTRAVENOUS

## 2017-01-19 MED ORDER — ONDANSETRON HCL 4 MG PO TABS: 4.0000 mg | ORAL_TABLET | Freq: Four times a day (QID) | ORAL | Status: DC | PRN

## 2017-01-19 MED ORDER — ALBUTEROL SULFATE (2.5 MG/3ML) 0.083% IN NEBU: 3.0000 mL | INHALATION_SOLUTION | RESPIRATORY_TRACT | Status: DC | PRN

## 2017-01-19 MED ORDER — SODIUM CHLORIDE 0.9 % IJ SOLN
INTRAMUSCULAR | Status: AC
  Filled 2017-01-19: qty 50

## 2017-01-19 MED ORDER — LACTATED RINGERS IV SOLN
INTRAVENOUS | Status: DC
  Administered 2017-01-19: 14:00:00 via INTRAVENOUS

## 2017-01-19 MED ORDER — DIPHENHYDRAMINE HCL 12.5 MG/5ML PO ELIX: 12.5000 mg | ORAL_SOLUTION | ORAL | Status: DC | PRN

## 2017-01-19 MED ORDER — PROPOFOL 10 MG/ML IV BOLUS
INTRAVENOUS | Status: AC
  Filled 2017-01-19: qty 60

## 2017-01-19 MED ORDER — DEXAMETHASONE SODIUM PHOSPHATE 10 MG/ML IJ SOLN
INTRAMUSCULAR | Status: AC
  Filled 2017-01-19: qty 1

## 2017-01-19 MED ORDER — OXYCODONE HCL 5 MG PO TABS
10.0000 mg | ORAL_TABLET | ORAL | Status: DC | PRN
  Administered 2017-01-21 (×2): 10 mg via ORAL
  Filled 2017-01-19 (×2): qty 2

## 2017-01-19 MED ORDER — BACLOFEN 10 MG PO TABS: 10.0000 mg | ORAL_TABLET | Freq: Three times a day (TID) | ORAL | 1 refills | Status: AC | PRN

## 2017-01-19 MED ORDER — EPHEDRINE SULFATE-NACL 50-0.9 MG/10ML-% IV SOSY
PREFILLED_SYRINGE | INTRAVENOUS | Status: DC | PRN
  Administered 2017-01-19 (×2): 5 mg via INTRAVENOUS

## 2017-01-19 MED ORDER — MENTHOL 3 MG MT LOZG: 1.0000 | LOZENGE | OROMUCOSAL | Status: DC | PRN

## 2017-01-19 MED ORDER — LACTATED RINGERS IV SOLN
INTRAVENOUS | Status: DC | PRN
  Administered 2017-01-19 (×3): via INTRAVENOUS

## 2017-01-19 MED ORDER — DEXAMETHASONE SODIUM PHOSPHATE 10 MG/ML IJ SOLN
10.0000 mg | Freq: Once | INTRAMUSCULAR | Status: AC
  Administered 2017-01-20: 10 mg via INTRAVENOUS
  Filled 2017-01-19: qty 1

## 2017-01-19 MED ORDER — FENTANYL CITRATE (PF) 100 MCG/2ML IJ SOLN
100.0000 ug | Freq: Once | INTRAMUSCULAR | Status: AC
  Administered 2017-01-19: 50 ug via INTRAVENOUS

## 2017-01-19 MED ORDER — CHLORHEXIDINE GLUCONATE 4 % EX LIQD: 60.0000 mL | Freq: Once | CUTANEOUS | Status: DC

## 2017-01-19 MED ORDER — EPHEDRINE 5 MG/ML INJ
INTRAVENOUS | Status: AC
  Filled 2017-01-19: qty 10

## 2017-01-19 MED ORDER — MIDAZOLAM HCL 2 MG/2ML IJ SOLN
2.0000 mg | Freq: Once | INTRAMUSCULAR | Status: AC
  Administered 2017-01-19: 2 mg via INTRAVENOUS

## 2017-01-19 MED ORDER — CEFAZOLIN SODIUM-DEXTROSE 2-4 GM/100ML-% IV SOLN
2.0000 g | Freq: Four times a day (QID) | INTRAVENOUS | Status: AC
  Administered 2017-01-19 – 2017-01-20 (×2): 2 g via INTRAVENOUS
  Filled 2017-01-19 (×2): qty 100

## 2017-01-19 MED ORDER — SODIUM CHLORIDE 0.9 % IV SOLN
INTRAVENOUS | Status: DC
  Administered 2017-01-19 – 2017-01-20 (×2): via INTRAVENOUS

## 2017-01-19 MED ORDER — PHENOL 1.4 % MT LIQD
1.0000 | OROMUCOSAL | Status: DC | PRN
  Filled 2017-01-19: qty 177

## 2017-01-19 MED ORDER — MIDAZOLAM HCL 2 MG/2ML IJ SOLN
INTRAMUSCULAR | Status: AC
  Administered 2017-01-19: 2 mg via INTRAVENOUS
  Filled 2017-01-19: qty 2

## 2017-01-19 MED ORDER — OXYCODONE HCL 5 MG PO TABS: 5.0000 mg | ORAL_TABLET | ORAL | 0 refills | Status: DC | PRN

## 2017-01-19 MED ORDER — KETOROLAC TROMETHAMINE 30 MG/ML IJ SOLN
INTRAMUSCULAR | Status: DC | PRN
  Administered 2017-01-19: 30 mg

## 2017-01-19 MED ORDER — ROPIVACAINE HCL 7.5 MG/ML IJ SOLN
INTRAMUSCULAR | Status: DC | PRN
  Administered 2017-01-19: 20 mL via PERINEURAL

## 2017-01-19 MED ORDER — POLYETHYLENE GLYCOL 3350 17 G PO PACK: 17.0000 g | PACK | Freq: Every day | ORAL | Status: DC | PRN

## 2017-01-19 MED ORDER — STERILE WATER FOR IRRIGATION IR SOLN
Status: DC | PRN
  Administered 2017-01-19: 2000 mL

## 2017-01-19 MED ORDER — SODIUM CHLORIDE 0.9 % IR SOLN
Status: DC | PRN
  Administered 2017-01-19: 1000 mL

## 2017-01-19 MED ORDER — SODIUM CHLORIDE 0.9 % IJ SOLN
INTRAMUSCULAR | Status: DC | PRN
  Administered 2017-01-19: 29 mL

## 2017-01-19 MED ORDER — MAGNESIUM CITRATE PO SOLN: 1.0000 | Freq: Once | ORAL | Status: DC | PRN

## 2017-01-19 MED ORDER — PROPOFOL 500 MG/50ML IV EMUL
INTRAVENOUS | Status: DC | PRN
  Administered 2017-01-19: 125 ug/kg/min via INTRAVENOUS

## 2017-01-19 MED ORDER — METOCLOPRAMIDE HCL 5 MG/ML IJ SOLN: 10.0000 mg | Freq: Once | INTRAMUSCULAR | Status: DC | PRN

## 2017-01-19 MED ORDER — ENOXAPARIN SODIUM 30 MG/0.3ML ~~LOC~~ SOLN
30.0000 mg | Freq: Two times a day (BID) | SUBCUTANEOUS | Status: DC
  Administered 2017-01-20 – 2017-01-21 (×3): 30 mg via SUBCUTANEOUS
  Filled 2017-01-19 (×3): qty 0.3

## 2017-01-19 MED ORDER — FENTANYL CITRATE (PF) 100 MCG/2ML IJ SOLN: 25.0000 ug | INTRAMUSCULAR | Status: DC | PRN

## 2017-01-19 MED ORDER — METHOCARBAMOL 1000 MG/10ML IJ SOLN
500.0000 mg | Freq: Four times a day (QID) | INTRAVENOUS | Status: DC | PRN
  Administered 2017-01-19: 500 mg via INTRAVENOUS
  Filled 2017-01-19: qty 550

## 2017-01-19 MED ORDER — ALUM & MAG HYDROXIDE-SIMETH 200-200-20 MG/5ML PO SUSP: 30.0000 mL | ORAL | Status: DC | PRN

## 2017-01-19 MED ORDER — METHOCARBAMOL 500 MG PO TABS
500.0000 mg | ORAL_TABLET | Freq: Four times a day (QID) | ORAL | Status: DC | PRN
  Administered 2017-01-20 – 2017-01-21 (×2): 500 mg via ORAL
  Filled 2017-01-19 (×2): qty 1

## 2017-01-19 MED ORDER — FENTANYL CITRATE (PF) 100 MCG/2ML IJ SOLN
INTRAMUSCULAR | Status: AC
  Administered 2017-01-19: 50 ug via INTRAVENOUS
  Filled 2017-01-19: qty 2

## 2017-01-19 MED ORDER — OXYCODONE HCL 5 MG PO TABS
5.0000 mg | ORAL_TABLET | ORAL | Status: DC | PRN
  Administered 2017-01-19 – 2017-01-20 (×4): 5 mg via ORAL
  Filled 2017-01-19 (×4): qty 1

## 2017-01-19 MED ORDER — BISACODYL 5 MG PO TBEC: 5.0000 mg | DELAYED_RELEASE_TABLET | Freq: Every day | ORAL | Status: DC | PRN

## 2017-01-19 MED ORDER — DOCUSATE SODIUM 100 MG PO CAPS
100.0000 mg | ORAL_CAPSULE | Freq: Two times a day (BID) | ORAL | Status: DC
  Administered 2017-01-20 – 2017-01-21 (×3): 100 mg via ORAL
  Filled 2017-01-19 (×3): qty 1

## 2017-01-19 MED ORDER — MEPERIDINE HCL 50 MG/ML IJ SOLN
INTRAMUSCULAR | Status: AC
  Filled 2017-01-19: qty 1

## 2017-01-19 MED ORDER — HYDROMORPHONE HCL 1 MG/ML IJ SOLN: 1.0000 mg | INTRAMUSCULAR | Status: DC | PRN

## 2017-01-19 MED ORDER — METOCLOPRAMIDE HCL 5 MG PO TABS: 5.0000 mg | ORAL_TABLET | Freq: Three times a day (TID) | ORAL | Status: DC | PRN

## 2017-01-19 MED ORDER — SODIUM CHLORIDE 0.9 % IV SOLN
1000.0000 mg | INTRAVENOUS | Status: AC
  Administered 2017-01-19: 1000 mg via INTRAVENOUS
  Filled 2017-01-19: qty 1100

## 2017-01-19 MED ORDER — ONDANSETRON HCL 4 MG/2ML IJ SOLN
INTRAMUSCULAR | Status: AC
  Filled 2017-01-19: qty 2

## 2017-01-19 MED ORDER — ACETAMINOPHEN 650 MG RE SUPP: 650.0000 mg | RECTAL | Status: DC | PRN

## 2017-01-19 MED ORDER — FERROUS SULFATE 325 (65 FE) MG PO TABS
325.0000 mg | ORAL_TABLET | Freq: Three times a day (TID) | ORAL | Status: DC
  Administered 2017-01-20 – 2017-01-21 (×4): 325 mg via ORAL
  Filled 2017-01-19 (×4): qty 1

## 2017-01-19 MED ORDER — MEPERIDINE HCL 50 MG/ML IJ SOLN
6.2500 mg | INTRAMUSCULAR | Status: DC | PRN
  Administered 2017-01-19 (×2): 6.25 mg via INTRAVENOUS

## 2017-01-19 MED ORDER — CEFAZOLIN SODIUM-DEXTROSE 2-4 GM/100ML-% IV SOLN
2.0000 g | INTRAVENOUS | Status: AC
  Administered 2017-01-19: 2 g via INTRAVENOUS
  Filled 2017-01-19: qty 100

## 2017-01-19 MED ORDER — PROPOFOL 10 MG/ML IV BOLUS
INTRAVENOUS | Status: AC
  Filled 2017-01-19: qty 20

## 2017-01-19 MED ORDER — BUPIVACAINE IN DEXTROSE 0.75-8.25 % IT SOLN
INTRATHECAL | Status: DC | PRN
  Administered 2017-01-19: 2 mL via INTRATHECAL

## 2017-01-19 MED ORDER — DEXAMETHASONE SODIUM PHOSPHATE 10 MG/ML IJ SOLN
10.0000 mg | Freq: Once | INTRAMUSCULAR | Status: AC
  Administered 2017-01-19: 10 mg via INTRAVENOUS

## 2017-01-19 MED ORDER — ONDANSETRON HCL 4 MG/2ML IJ SOLN
INTRAMUSCULAR | Status: DC | PRN
  Administered 2017-01-19: 4 mg via INTRAVENOUS

## 2017-01-19 MED ORDER — KETOROLAC TROMETHAMINE 30 MG/ML IJ SOLN
INTRAMUSCULAR | Status: AC
  Filled 2017-01-19: qty 1

## 2017-01-19 MED ORDER — METOCLOPRAMIDE HCL 5 MG/ML IJ SOLN: 5.0000 mg | Freq: Three times a day (TID) | INTRAMUSCULAR | Status: DC | PRN

## 2017-01-19 MED ORDER — ACETAMINOPHEN 325 MG PO TABS: 650.0000 mg | ORAL_TABLET | ORAL | Status: DC | PRN

## 2017-01-19 MED ORDER — BUPIVACAINE-EPINEPHRINE 0.25% -1:200000 IJ SOLN
INTRAMUSCULAR | Status: DC | PRN
  Administered 2017-01-19: 30 mL

## 2017-01-19 MED ORDER — ASPIRIN EC 325 MG PO TBEC: 325.0000 mg | DELAYED_RELEASE_TABLET | Freq: Two times a day (BID) | ORAL | 0 refills | Status: AC

## 2017-01-19 SURGICAL SUPPLY — 64 items
ADH SKN CLS APL DERMABOND .7 (GAUZE/BANDAGES/DRESSINGS) ×1
BAG SPEC THK2 15X12 ZIP CLS (MISCELLANEOUS) ×2
BAG ZIPLOCK 12X15 (MISCELLANEOUS) ×6 IMPLANT
BANDAGE ACE 4X5 VEL STRL LF (GAUZE/BANDAGES/DRESSINGS) ×3 IMPLANT
BANDAGE ACE 6X5 VEL STRL LF (GAUZE/BANDAGES/DRESSINGS) ×3 IMPLANT
BLADE SAG 18X100X1.27 (BLADE) ×3 IMPLANT
BLADE SAW SGTL 13.0X1.19X90.0M (BLADE) ×3 IMPLANT
BOWL SMART MIX CTS (DISPOSABLE) ×3 IMPLANT
CAP KNEE TOTAL 3 SIGMA ×2 IMPLANT
CEMENT HV SMART SET (Cement) ×4 IMPLANT
COVER SURGICAL LIGHT HANDLE (MISCELLANEOUS) ×3 IMPLANT
CUFF TOURN SGL QUICK 34 (TOURNIQUET CUFF) ×3
CUFF TRNQT CYL 34X4X40X1 (TOURNIQUET CUFF) ×1 IMPLANT
DECANTER SPIKE VIAL GLASS SM (MISCELLANEOUS) ×3 IMPLANT
DERMABOND ADVANCED (GAUZE/BANDAGES/DRESSINGS) ×2
DERMABOND ADVANCED .7 DNX12 (GAUZE/BANDAGES/DRESSINGS) ×1 IMPLANT
DRAPE U-SHAPE 47X51 STRL (DRAPES) ×3 IMPLANT
DRESSING AQUACEL AG SP 3.5X10 (GAUZE/BANDAGES/DRESSINGS) IMPLANT
DRSG AQUACEL AG SP 3.5X10 (GAUZE/BANDAGES/DRESSINGS) ×3
DRSG TEGADERM 4X4.75 (GAUZE/BANDAGES/DRESSINGS) ×3 IMPLANT
DURAPREP 26ML APPLICATOR (WOUND CARE) ×6 IMPLANT
ELECT REM PT RETURN 15FT ADLT (MISCELLANEOUS) ×3 IMPLANT
EVACUATOR 1/8 PVC DRAIN (DRAIN) ×3 IMPLANT
GAUZE SPONGE 2X2 8PLY STRL LF (GAUZE/BANDAGES/DRESSINGS) ×1 IMPLANT
GLOVE BIO SURGEON STRL SZ 6.5 (GLOVE) ×1 IMPLANT
GLOVE BIO SURGEONS STRL SZ 6.5 (GLOVE) ×1
GLOVE BIOGEL PI IND STRL 6 (GLOVE) IMPLANT
GLOVE BIOGEL PI IND STRL 7.0 (GLOVE) IMPLANT
GLOVE BIOGEL PI IND STRL 7.5 (GLOVE) IMPLANT
GLOVE BIOGEL PI IND STRL 8 (GLOVE) ×2 IMPLANT
GLOVE BIOGEL PI INDICATOR 6 (GLOVE) ×2
GLOVE BIOGEL PI INDICATOR 7.0 (GLOVE) ×4
GLOVE BIOGEL PI INDICATOR 7.5 (GLOVE) ×4
GLOVE BIOGEL PI INDICATOR 8 (GLOVE) ×6
GLOVE ECLIPSE 8.0 STRL XLNG CF (GLOVE) ×6 IMPLANT
GLOVE SURG ORTHO 9.0 STRL STRW (GLOVE) ×3 IMPLANT
GLOVE SURG SS PI 7.5 STRL IVOR (GLOVE) ×3 IMPLANT
GOWN STRL REUS W/TWL LRG LVL3 (GOWN DISPOSABLE) ×2 IMPLANT
GOWN STRL REUS W/TWL XL LVL3 (GOWN DISPOSABLE) ×8 IMPLANT
HANDPIECE INTERPULSE COAX TIP (DISPOSABLE) ×3
IMMOBILIZER KNEE 20 (SOFTGOODS) ×3
IMMOBILIZER KNEE 20 THIGH 36 (SOFTGOODS) ×1 IMPLANT
PACK TOTAL KNEE CUSTOM (KITS) ×3 IMPLANT
POSITIONER SURGICAL ARM (MISCELLANEOUS) ×3 IMPLANT
SET HNDPC FAN SPRY TIP SCT (DISPOSABLE) ×1 IMPLANT
SET PAD KNEE POSITIONER (MISCELLANEOUS) ×3 IMPLANT
SPONGE GAUZE 2X2 STER 10/PKG (GAUZE/BANDAGES/DRESSINGS) ×2
SPONGE SURGIFOAM ABS GEL 100 (HEMOSTASIS) ×3 IMPLANT
STOCKINETTE 6  STRL (DRAPES) ×2
STOCKINETTE 6 STRL (DRAPES) ×1 IMPLANT
SUCTION FRAZIER HANDLE 12FR (TUBING) ×2
SUCTION TUBE FRAZIER 12FR DISP (TUBING) ×1 IMPLANT
SUT BONE WAX W31G (SUTURE) IMPLANT
SUT MNCRL AB 3-0 PS2 18 (SUTURE) ×3 IMPLANT
SUT VIC AB 1 CT1 27 (SUTURE) ×12
SUT VIC AB 1 CT1 27XBRD ANTBC (SUTURE) ×4 IMPLANT
SUT VIC AB 2-0 CT1 27 (SUTURE) ×6
SUT VIC AB 2-0 CT1 TAPERPNT 27 (SUTURE) ×2 IMPLANT
SUT VLOC 180 0 24IN GS25 (SUTURE) ×3 IMPLANT
SYR 50ML LL SCALE MARK (SYRINGE) ×3 IMPLANT
TAPE STRIPS DRAPE STRL (GAUZE/BANDAGES/DRESSINGS) ×3 IMPLANT
TRAY FOLEY CATH SILVER 14FR (SET/KITS/TRAYS/PACK) ×2 IMPLANT
WRAP KNEE MAXI GEL POST OP (GAUZE/BANDAGES/DRESSINGS) ×3 IMPLANT
YANKAUER SUCT BULB TIP 10FT TU (MISCELLANEOUS) ×3 IMPLANT

## 2017-01-19 NOTE — Interval H&P Note (Signed)
History and Physical Interval Note:  01/19/2017 3:20 PM  Heather Blackeraroline C Aronoff  has presented today for surgery, with the diagnosis of Left knee osteoarthritis  The various methods of treatment have been discussed with the patient and family. After consideration of risks, benefits and other options for treatment, the patient has consented to  Procedure(s): LEFT TOTAL KNEE ARTHROPLASTY (Left) as a surgical intervention .  The patient's history has been reviewed, patient examined, no change in status, stable for surgery.  I have reviewed the patient's chart and labs.  Questions were answered to the patient's satisfaction.     Shlomo Seres ANDREW

## 2017-01-19 NOTE — Transfer of Care (Signed)
Immediate Anesthesia Transfer of Care Note  Patient: Heather Fitzpatrick  Procedure(s) Performed: Procedure(s) with comments: LEFT TOTAL KNEE ARTHROPLASTY (Left) - Adductor Block  Patient Location: PACU  Anesthesia Type:Spinal  Level of Consciousness:  sedated, patient cooperative and responds to stimulation  Airway & Oxygen Therapy:Patient Spontanous Breathing and Patient connected to face mask oxgen  Post-op Assessment:  Report given to PACU RN and Post -op Vital signs reviewed and stable  Post vital signs:  Reviewed and stable  Last Vitals:  Vitals:   01/19/17 1455 01/19/17 1456  BP:    Pulse: 74 77  Resp: 18 14  Temp:    SpO2: 100% 99%    Complications: No apparent anesthesia complications

## 2017-01-19 NOTE — Interval H&P Note (Signed)
History and Physical Interval Note:  01/19/2017 1:19 PM  Heather Fitzpatrick  has presented today for surgery, with the diagnosis of Left knee osteoarthritis  The various methods of treatment have been discussed with the patient and family. After consideration of risks, benefits and other options for treatment, the patient has consented to  Procedure(s): LEFT TOTAL KNEE ARTHROPLASTY (Left) as a surgical intervention .  The patient's history has been reviewed, patient examined, no change in status, stable for surgery.  I have reviewed the patient's chart and labs.  Questions were answered to the patient's satisfaction.     [name] ANDREW

## 2017-01-19 NOTE — Op Note (Signed)
DATE OF SURGERY:  01/19/2017  TIME: 5:46 PM  PATIENT NAME:  Heather Fitzpatrick    AGE: 71 y.o.   PRE-OPERATIVE DIAGNOSIS:  Left knee osteoarthritis  POST-OPERATIVE DIAGNOSIS:  Left knee osteoarthritis  PROCEDURE:  Procedure(s): LEFT TOTAL KNEE ARTHROPLASTY   SURGEON:  Cheryal Salas ANDREW  ASSISTANT:  Bryson Stilwell, PA-C, present and scrubbed throughout the case, critical for assistance with exposure, retraction, instrumentation, and closure.  OPERATIVE IMPLANTS: Depuy PFC Sigma Rotating Platform.  Femur size 4, Tibia size 4, Patella size 35 3-peg oval button, with a 10 mm polyethylene insert.   PREOPERATIVE INDICATIONS:   [name] is a 71 y.o. year old female with end stage bone on bone arthritis of the knee who failed conservative treatment and elected for Total Knee Arthroplasty.   The risks, benefits, and alternatives were discussed at length including but not limited to the risks of infection, bleeding, nerve injury, stiffness, blood clots, the need for revision surgery, cardiopulmonary complications, among others, and they were willing to proceed.  OPERATIVE DESCRIPTION:  The patient was brought to the operative room and placed in a supine position.  Spinal anesthesia was administered.  IV antibiotics were given.  The lower extremity was prepped and draped in the usual sterile fashion.  Time out was performed.  The leg was elevated and exsanguinated and the tourniquet was inflated.  Anterior quadriceps tendon splitting approach was performed.  The patella was retracted and osteophytes were removed.  The anterior horn of the medial and lateral meniscus was removed and cruciate ligaments resected.   The distal femur was opened with the drill and the intramedullary distal femoral cutting jig was utilized, set at 5 degrees resecting 10 mm off the distal femur.  Care was taken to protect the collateral ligaments.  The distal femoral sizing jig was applied, taking  care to avoid notching.  Then the 4-in-1 cutting jig was applied and the anterior and posterior femur was cut, along with the chamfer cuts.    Then the extramedullary tibial cutting jig was utilized making the appropriate cut using the anterior tibial crest as a reference building in appropriate posterior slope.  Care was taken during the cut to protect the medial and collateral ligaments.  The proximal tibia was removed along with the posterior horns of the menisci.   The posterior medial femoral osteophytes and posterior lateral femoral osteophytes were removed.    The flexion gap was then measured and was symmetric with the extension gap, measured at 10.  I completed the distal femoral preparation using the appropriate jig to prepare the box.  The patella was then measured, and cut with the saw.    The proximal tibia sized and prepared accordingly with the reamer and the punch, and then all components were trialed with the trial insert.  The knee was found to have excellent balance and full motion.    The above named components were then cemented into place and all excess cement was removed.  The trial polyethylene component was in place during cementation, and then was exchanged for the real polyethylene component.    The knee was easily taken through a range of motion and the patella tracked well and the knee irrigated copiously and the parapatellar and subcutaneous tissue closed with vicryl, and monocryl with steri strips for the skin.  The arthrotomy was closed at 90 of flexion. The wounds were dressed with sterile gauze and the tourniquet released and the patient was awakened and returned to the  PACU in stable and satisfactory condition.  There were no complications.  Total tourniquet time was 75 minutes.

## 2017-01-19 NOTE — Anesthesia Preprocedure Evaluation (Signed)
Anesthesia Evaluation  Patient identified by MRN, date of birth, ID band Patient awake    Reviewed: Allergy & Precautions, NPO status , Patient's Chart, lab work & pertinent test results  Airway Mallampati: II  TM Distance: >3 FB Neck ROM: Full    Dental no notable dental hx. (+) Teeth Intact   Pulmonary asthma ,    Pulmonary exam normal breath sounds clear to auscultation       Cardiovascular Normal cardiovascular exam+ dysrhythmias  Rhythm:Regular Rate:Normal  PVC's occasional palpitations   Neuro/Psych PSYCHIATRIC DISORDERS Depression negative neurological ROS     GI/Hepatic negative GI ROS, Neg liver ROS,   Endo/Other  Hyperlipidemia  Renal/GU negative Renal ROS  negative genitourinary   Musculoskeletal  (+) Arthritis , Osteoarthritis,  OA left knee   Abdominal   Peds  Hematology negative hematology ROS (+)   Anesthesia Other Findings   Reproductive/Obstetrics                             Anesthesia Physical Anesthesia Plan  ASA: II  Anesthesia Plan: Spinal   Post-op Pain Management:  Regional for Post-op pain   Induction:   PONV Risk Score and Plan: 3 and Ondansetron, Dexamethasone, Midazolam and Propofol infusion  Airway Management Planned: Natural Airway and Nasal Cannula  Additional Equipment:   Intra-op Plan:   Post-operative Plan:   Informed Consent: I have reviewed the patients History and Physical, chart, labs and discussed the procedure including the risks, benefits and alternatives for the proposed anesthesia with the patient or authorized representative who has indicated his/her understanding and acceptance.   Dental advisory given  Plan Discussed with: CRNA, Anesthesiologist and Surgeon  Anesthesia Plan Comments:         Anesthesia Quick Evaluation

## 2017-01-19 NOTE — Anesthesia Procedure Notes (Signed)
Anesthesia Regional Block: Adductor canal block   Pre-Anesthetic Checklist: ,, timeout performed, Correct Patient, Correct Site, Correct Laterality, Correct Procedure, Correct Position, site marked, Risks and benefits discussed,  Surgical consent,  Pre-op evaluation,  At surgeon's request and post-op pain management  Laterality: Left  Prep: chloraprep       Needles:  Injection technique: Single-shot  Needle Type: Echogenic Stimulator Needle     Needle Length: 9cm  Needle Gauge: 21   Needle insertion depth: 4 cm   Additional Needles:   Procedures:,,,, ultrasound used (permanent image in chart),,,,  Narrative:  Start time: 01/19/2017 2:45 PM End time: 01/19/2017 2:50 PM Injection made incrementally with aspirations every 5 mL.  Performed by: Personally  Anesthesiologist: Mal AmabileFOSTER, Nikita Humble  Additional Notes: Timeout performed. Patient sedated. Relevant anatomy ID'd using US. Incremental 2-795ml injection of LA with frequent aspiration. Patient tolerated procedure well.

## 2017-01-19 NOTE — Progress Notes (Signed)
AssistedDr. Foster with left, ultrasound guided, adductor canal block. Side rails up, monitors on throughout procedure. See vital signs in flow sheet. Tolerated Procedure well.  

## 2017-01-19 NOTE — Anesthesia Postprocedure Evaluation (Signed)
Anesthesia Post Note  Patient: Heather Fitzpatrick  Procedure(s) Performed: LEFT TOTAL KNEE ARTHROPLASTY (Left Knee)     Patient location during evaluation: PACU Anesthesia Type: Spinal Level of consciousness: oriented and awake and alert Pain management: pain level controlled Vital Signs Assessment: post-procedure vital signs reviewed and stable Respiratory status: spontaneous breathing, respiratory function stable and nonlabored ventilation Cardiovascular status: blood pressure returned to baseline and stable Postop Assessment: no headache, no backache, no apparent nausea or vomiting, spinal receding and patient able to bend at knees Anesthetic complications: no    Last Vitals:  Vitals:   01/19/17 1845 01/19/17 1858  BP: 138/73 136/81  Pulse: 68 66  Resp: 10 11  Temp:    SpO2: 99% 100%    Last Pain:  Vitals:   01/19/17 1845  TempSrc:   PainSc: 0-No pain                 Amera Banos A.

## 2017-01-19 NOTE — Anesthesia Procedure Notes (Signed)
Spinal  Patient location during procedure: OR Start time: 01/19/2017 4:16 PM Staffing Anesthesiologist: Mal AmabileFOSTER, [place] Performed: anesthesiologist  Preanesthetic Checklist Completed: patient identified, site marked, surgical consent, pre-op evaluation, timeout performed, IV checked, risks and benefits discussed and monitors and equipment checked Spinal Block Patient position: sitting Prep: site prepped and draped and DuraPrep Patient monitoring: heart rate, cardiac monitor, continuous pulse ox and blood pressure Approach: midline Location: L3-4 Injection technique: single-shot Needle Needle type: Pencan  Needle gauge: 24 G Needle length: 9 cm Needle insertion depth: 5 cm Assessment Sensory level: T4 Additional Notes Patient tolerated procedure well. Adequate sensory level.

## 2017-01-19 NOTE — Anesthesia Procedure Notes (Signed)
Date/Time: 01/19/2017 4:15 PM Performed by: Leroy LibmanEARDON, Nassir Neidert L Oxygen Delivery Method: Simple face mask

## 2017-01-20 LAB — CBC
HEMATOCRIT: 31 % — AB (ref 36.0–46.0)
HEMOGLOBIN: 10.3 g/dL — AB (ref 12.0–15.0)
MCH: 28.9 pg (ref 26.0–34.0)
MCHC: 33.2 g/dL (ref 30.0–36.0)
MCV: 87.1 fL (ref 78.0–100.0)
Platelets: 225 10*3/uL (ref 150–400)
RBC: 3.56 MIL/uL — ABNORMAL LOW (ref 3.87–5.11)
RDW: 14.7 % (ref 11.5–15.5)
WBC: 7.9 10*3/uL (ref 4.0–10.5)

## 2017-01-20 LAB — BASIC METABOLIC PANEL
Anion gap: 7 (ref 5–15)
BUN: 13 mg/dL (ref 6–20)
CHLORIDE: 104 mmol/L (ref 101–111)
CO2: 27 mmol/L (ref 22–32)
CREATININE: 0.77 mg/dL (ref 0.44–1.00)
Calcium: 8.9 mg/dL (ref 8.9–10.3)
GFR calc Af Amer: >60 mL/min (ref >60)
GFR calc non Af Amer: >60 mL/min (ref >60)
GLUCOSE: 126 mg/dL — AB (ref 65–99)
Potassium: 4 mmol/L (ref 3.5–5.1)
Sodium: 138 mmol/L (ref 135–145)

## 2017-01-20 NOTE — Care Management Note (Signed)
Case Management Note  Patient Details  Name: Corliss BlackerCaroline C Chaires MRN: [id number] Date of Birth: 1945/07/29  Subjective/Objective:       S/p Left TKA             Action/Plan: Discharge Planning: NCM spoke to pt and offered choice for HH/list provided. Pt agreeable to [facility]. States she has RW and bedside commode at home that she borrowed from friend. Has family arranged to assist at home.  PCP WHITE, CYNTHIA MD  Expected Discharge Date:  01/21/17               Expected Discharge Plan:  Home w Home Health Services  In-House Referral:  NA  Discharge planning Services  CM Consult  Post Acute Care Choice:  Home Health Choice offered to:  Patient  DME Arranged:  N/A DME Agency:  NA  HH Arranged:  PT HH Agency:  [facility] (formerly [facility])  Status of Service:  Completed, signed off  If discussed at [employer] Meetings, dates discussed:    Additional Comments:  Elliot CousinShavis, Mariaisabel Bodiford Ellen, RN 01/20/2017, 12:14 PM

## 2017-01-20 NOTE — Progress Notes (Signed)
Subjective: 1 Day Post-Op Procedure(s) (LRB): LEFT TOTAL KNEE ARTHROPLASTY (Left) Patient reports pain as good.  Reports a good night. Progressing with PT. Tolerating PO's. Denies SOB,CP, or calf pain.  Objective: Vital signs in last 24 hours: Temp:  [97.6 F (36.4 C)-98.3 F (36.8 C)] 98.1 F (36.7 C) (10/20 0513) Pulse Rate:  [65-83] 69 (10/20 0513) Resp:  [6-21] 16 (10/20 0513) BP: (95-157)/(53-87) 110/60 (10/20 0513) SpO2:  [96 %-100 %] 98 % (10/20 0513) Weight:  [77.1 kg (170 lb)] 77.1 kg (170 lb) (10/19 1342)  Intake/Output from previous day: 10/19 0701 - 10/20 0700 In: 3102.8 [P.O.:240; I.V.:2663.8; IV Piggyback:199] Out: 2170 [Urine:1975; Drains:95; Blood:100] Intake/Output this shift: No intake/output data recorded.   Recent Labs  01/20/17 0616  HGB 10.3*    Recent Labs  01/20/17 0616  WBC 7.9  RBC 3.56*  HCT 31.0*  PLT 225    Recent Labs  01/20/17 0616  NA 138  K 4.0  CL 104  CO2 27  BUN 13  CREATININE 0.77  GLUCOSE 126*  CALCIUM 8.9   No results for input(s): LABPT, INR in the last 72 hours.  Alert and oriented x3. RRR, Lungs clear, BS x4. Left Calf soft and non tender. L knee dressing C/D/I. No DVT signs. No signs of infection or compartment syndrome. LLE grossly neurovascularly intact.  Assessment/Plan: 1 Day Post-Op Procedure(s) (LRB): LEFT TOTAL KNEE ARTHROPLASTY (Left) Drain D/c'ed with tip intact D/c home tomorrow Up with PT   Billie Trager L 01/20/2017, 11:14 AM

## 2017-01-20 NOTE — Progress Notes (Signed)
Foley cath removed at approximately 0630. Patient tolerated procedure.

## 2017-01-20 NOTE — Evaluation (Signed)
Physical Therapy Evaluation Patient Details Name: Heather BlackerCaroline C Ige MRN: [id number] DOB: 1946/03/19 Today's Date: 01/20/2017   History of Present Illness  Pt admitted on 01/19/17 and s/p L TKA.   Clinical Impression  Pt is s/p TKA resulting in the deficits listed below (see PT Problem List). Pt will benefit from skilled PT to increase their independence and safety with mobility to allow discharge to the venue listed below.      Follow Up Recommendations DC plan and follow up therapy as arranged by surgeon    Equipment Recommendations  None recommended by PT    Recommendations for Other Services       Precautions / Restrictions Precautions Precautions: Fall Restrictions Weight Bearing Restrictions: No Other Position/Activity Restrictions: WBAT      Mobility  Bed Mobility               General bed mobility comments: OOB to chair  with OT  Transfers Overall transfer level: Needs assistance Equipment used: Rolling walker (2 wheeled) Transfers: Sit to/from Stand Sit to Stand: Min guard         General transfer comment: cues for hand placement and LLE position  Ambulation/Gait Ambulation/Gait assistance: Min guard Ambulation Distance (Feet): 80 Feet Assistive device: Rolling walker (2 wheeled) Gait Pattern/deviations: Step-to pattern;Step-through pattern     General Gait Details: cues for posture and RW position from self  Stairs            Wheelchair Mobility    Modified Rankin (Stroke Patients Only)       Balance                                             Pertinent Vitals/Pain Pain Assessment: 0-10 Pain Score: 2  Pain Location: L knee Pain Intervention(s): Limited activity within patient's tolerance;Monitored during session;Premedicated before session;Ice applied    Home Living Family/patient expects to be discharged to:: Private residence Living Arrangements: Children;Non-relatives/Friends Available Help at  Discharge: Family;Friend(s);Available 24 hours/day Type of Home: House Home Access: Stairs to enter   Enter[employer] CorporationEntrance Stairs-Number of Steps: 2 and a half step Home Layout: One level Home Equipment: Walker - 2 wheels;Bedside commode;Adaptive equipment;Shower seat;Hand held shower head      Prior Function Level of Independence: Independent               Hand Dominance        Extremity/Trunk Assessment   Upper Extremity Assessment Upper Extremity Assessment: Defer to OT evaluation    Lower Extremity Assessment Lower Extremity Assessment: LLE deficits/detail LLE Deficits / Details: ankle WFL; knee extension and hip flexion grossly 3/3       Communication   Communication: No difficulties  Cognition Arousal/Alertness: Awake/alert Behavior During Therapy: WFL for tasks assessed/performed Overall Cognitive Status: Within Functional Limits for tasks assessed                                        General Comments      Exercises Total Joint Exercises Ankle Circles/Pumps: AROM;Both;10 reps Quad Sets: 10 reps;Both;AROM Heel Slides: AROM;Both;10 reps   Assessment/Plan    PT Assessment Patient needs continued PT services  PT Problem List Decreased strength;Decreased range of motion;Decreased activity tolerance;Decreased mobility;Pain;Decreased knowledge of use of DME       PT  Treatment Interventions DME instruction;Gait training;Functional mobility training;Therapeutic activities;Therapeutic exercise;Patient/family education;Stair training    PT Goals (Current goals can be found in the Care Plan section)  Acute Rehab PT Goals Patient Stated Goal: to return to active lifestyle PT Goal Formulation: With patient Time For Goal Achievement: 01/24/17 Potential to Achieve Goals: Good    Frequency 7X/week   Barriers to discharge        Co-evaluation               AM-PAC PT "6 Clicks" Daily Activity  Outcome Measure Difficulty turning over in bed  (including adjusting bedclothes, sheets and blankets)?: Unable Difficulty moving from lying on back to sitting on the side of the bed? : Unable Difficulty sitting down on and standing up from a chair with arms (e.g., wheelchair, bedside commode, etc,.)?: Unable Help needed moving to and from a bed to chair (including a wheelchair)?: A Little Help needed walking in hospital room?: A Little Help needed climbing 3-5 steps with a railing? : A Little 6 Click Score: 12    End of Session Equipment Utilized During Treatment: Gait belt Activity Tolerance: Patient tolerated treatment well Patient left: with call bell/phone within reach;in chair;with family/visitor present   PT Visit Diagnosis: Difficulty in walking, not elsewhere classified (R26.2)    Time: 4098-1191 PT Time Calculation (min) (ACUTE ONLY): 16 min   Charges:   PT Evaluation $PT Eval Low Complexity: 1 Low     PT G CodesDrucilla Chalet, PT Pager: [phone] 01/20/2017   Drucilla Chalet 01/20/2017, 12:06 PM

## 2017-01-20 NOTE — Progress Notes (Signed)
Subjective: 1 Day Post-Op Procedure(s) (LRB): LEFT TOTAL KNEE ARTHROPLASTY (Left) Patient reports pain as mild.  Reports a good night. Tolerating PO's. Progress with PT. Denies CP, SOB,or calf pain.  Objective: Vital signs in last 24 hours: Temp:  [97.6 F (36.4 C)-98.3 F (36.8 C)] 98.1 F (36.7 C) (10/20 0513) Pulse Rate:  [65-83] 69 (10/20 0513) Resp:  [6-21] 16 (10/20 0513) BP: (95-157)/(53-87) 110/60 (10/20 0513) SpO2:  [96 %-100 %] 98 % (10/20 0513) Weight:  [77.1 kg (170 lb)] 77.1 kg (170 lb) (10/19 1342)  Intake/Output from previous day: 10/19 0701 - 10/20 0700 In: 3102.8 [P.O.:240; I.V.:2663.8; IV Piggyback:199] Out: 2170 [Urine:1975; Drains:95; Blood:100] Intake/Output this shift: No intake/output data recorded.   Recent Labs  01/20/17 0616  HGB 10.3*    Recent Labs  01/20/17 0616  WBC 7.9  RBC 3.56*  HCT 31.0*  PLT 225    Recent Labs  01/20/17 0616  NA 138  K 4.0  CL 104  CO2 27  BUN 13  CREATININE 0.77  GLUCOSE 126*  CALCIUM 8.9   No results for input(s): LABPT, INR in the last 72 hours.  Alert and oriented x3. RRR, Lungs clear, BS x4. Left Calf soft and non tender. L knee dressing C/D/I. No DVT signs. No signs of infection or compartment syndrome. LLE grossly neurovascularly intact.   Assessment/Plan: 1 Day Post-Op Procedure(s) (LRB): LEFT TOTAL KNEE ARTHROPLASTY (Left) Plan D/c tomorrow UP with PT Drain D/c'ed with tip intact Advance diet  Osamah Schmader L 01/20/2017, 11:01 AM

## 2017-01-20 NOTE — Evaluation (Signed)
Occupational Therapy Evaluation Patient Details Name: Heather Fitzpatrick MRN: [id number] DOB: [birth year] Today's Date: 01/20/2017    History of Present Illness Pt admitted on 01/19/17 and s/p L TKA.    Clinical Impression   Pt doing well and tolerated up to chair with walker well. Pt educated on LB dressing tasks and safe walker use. Will benefit from continued OT to progress ADL independence for return home with family and friends assisting.    Follow Up Recommendations  No OT follow up;Supervision/Assistance - 24 hour    Equipment Recommendations  None recommended by OT    Recommendations for Other Services       Precautions / Restrictions Precautions Precautions: Fall Required Braces or Orthoses: Knee Immobilizer - Left Restrictions Weight Bearing Restrictions: No Other Position/Activity Restrictions: WBAT      Mobility Bed Mobility Overal bed mobility: Needs Assistance Bed Mobility: Supine to Sit     Supine to sit: Min assist     General bed mobility comments: assist for L LE over to EOB. cues for how to self assist.   Transfers Overall transfer level: Needs assistance Equipment used: Rolling walker (2 wheeled) Transfers: Sit to/from Stand Sit to Stand: Min assist         General transfer comment: cues for hand placement and LE management.    Balance                                           ADL either performed or assessed with clinical judgement   ADL Overall ADL's : Needs assistance/impaired Eating/Feeding: Independent;Sitting   Grooming: Wash/dry hands;Set up;Sitting   Upper Body Bathing: Set up;Sitting   Lower Body Bathing: Minimal assistance;Sit to/from stand   Upper Body Dressing : Set up;Sitting   Lower Body Dressing: Moderate assistance;Sit to/from stand   Toilet Transfer: Minimal assistance;Stand-pivot;BSC;RW   Toileting- Clothing Manipulation and Hygiene: Minimal assistance;Sit to/from stand          General ADL Comments: Pt up to chair with walker and tolerated very well. Began education on LB dressing and discussed AE options but pt is able to reach down to the top of her L foot so anticipate she will soon don socks even without AE. Educated on sequence for LB dressing and safe walker use.      Vision Patient Visual Report: No change from baseline       Perception     Praxis      Pertinent Vitals/Pain Pain Assessment: 0-10 Pain Score: 2  Pain Location: L knee Pain Intervention(s): Limited activity within patient's tolerance;Monitored during session;Premedicated before session;Ice applied     Hand Dominance     Extremity/Trunk Assessment Upper Extremity Assessment Upper Extremity Assessment: Overall WFL for tasks assessed          Communication Communication Communication: No difficulties   Cognition Arousal/Alertness: Awake/alert Behavior During Therapy: WFL for tasks assessed/performed Overall Cognitive Status: Within Functional Limits for tasks assessed                                     General Comments       Exercises    Shoulder Instructions      Home Living Family/patient expects to be discharged to:: Private residence Living Arrangements: Children;Non-relatives/Friends Available Help at Discharge: Family;Friend(s);Available 24 hours/day Type of  Home: House Home Access: Stairs to enter Enter[employer] CorporationEntrance Stairs-Number of Steps: 2 and a half step   Home Layout: One level     Bathroom Shower/Tub: Chief Strategy OfficerTub/shower unit   Bathroom Toilet: Standard     Home Equipment: Environmental consultantWalker - 2 wheels;Bedside commode;Adaptive equipment;Shower seat;Hand held Clinical biochemistshower head Adaptive Equipment: Reacher        Prior Functioning/Environment Level of Independence: Independent                 OT Problem List: Decreased strength;Decreased knowledge of use of DME or AE      OT Treatment/Interventions: Self-care/ADL training;Patient/family education;DME  and/or AE instruction;Therapeutic activities    OT Goals(Current goals can be found in the care plan section) Acute Rehab OT Goals Patient Stated Goal: return to independence OT Goal Formulation: With patient Time For Goal Achievement: 01/27/17 Potential to Achieve Goals: Good  OT Frequency: Min 2X/week   Barriers to D/C:            Co-evaluation              AM-PAC PT "6 Clicks" Daily Activity     Outcome Measure Help from another person eating meals?: None Help from another person taking care of personal grooming?: None Help from another person toileting, which includes using toliet, bedpan, or urinal?: A Little Help from another person bathing (including washing, rinsing, drying)?: A Little Help from another person to put on and taking off regular upper body clothing?: None Help from another person to put on and taking off regular lower body clothing?: A Little 6 Click Score: 21   End of Session Equipment Utilized During Treatment: Rolling walker;Left knee immobilizer  Activity Tolerance: Patient tolerated treatment well Patient left: in chair;with call bell/phone within reach  OT Visit Diagnosis: Unsteadiness on feet (R26.81);Muscle weakness (generalized) (M62.81)                Time: 1000-1036 OT Time Calculation (min): 36 min Charges:  OT General Charges $OT Visit: 1 Visit OT Evaluation $OT Eval Low Complexity: 1 Low OT Treatments $Therapeutic Activity: 8-22 mins G-Codes:       Zannie KehrStephanie S Filiberto Wamble 01/20/2017, 12:51 PM

## 2017-01-20 NOTE — Progress Notes (Signed)
Physical Therapy Treatment Patient Details Name: Heather BlackerCaroline C Fitzpatrick MRN: [id number] DOB: 1945-11-18 Today's Date: 01/20/2017    History of Present Illness Pt admitted on 01/19/17 and s/p L TKA.     PT Comments    Progressing well; will see again in am, pt hopeful to D/C tomorrow   Follow Up Recommendations  DC plan and follow up therapy as arranged by surgeon     Equipment Recommendations  None recommended by PT    Recommendations for Other Services       Precautions / Restrictions Precautions Precautions: Fall Precaution Comments: IND SLRs Required Braces or Orthoses: Knee Immobilizer - Left Restrictions Weight Bearing Restrictions: No Other Position/Activity Restrictions: WBAT    Mobility  Bed Mobility Overal bed mobility: Needs Assistance Bed Mobility: Sit to Supine     Supine to sit: Min assist Sit to supine: Min assist   General bed mobility comments: assist with LLE  Transfers Overall transfer level: Needs assistance Equipment used: Rolling walker (2 wheeled) Transfers: Sit to/from Stand Sit to Stand: Min guard         General transfer comment: cues for hand placement and LE management.  Ambulation/Gait Ambulation/Gait assistance: Min guard Ambulation Distance (Feet): 160 Feet Assistive device: Rolling walker (2 wheeled) Gait Pattern/deviations: Step-to pattern;Step-through pattern     General Gait Details: cues for posture and RW position from self   Stairs            Wheelchair Mobility    Modified Rankin (Stroke Patients Only)       Balance                                            Cognition Arousal/Alertness: Awake/alert Behavior During Therapy: WFL for tasks assessed/performed Overall Cognitive Status: Within Functional Limits for tasks assessed                                        Exercises Total Joint Exercises Ankle Circles/Pumps: AROM;Both;10 reps Quad Sets: 10  reps;Both;AROM Heel Slides: AROM;Both;10 reps;AAROM    General Comments        Pertinent Vitals/Pain Pain Assessment: 0-10 Pain Score: 3  Pain Location: L knee Pain Descriptors / Indicators: Discomfort;Sore Pain Intervention(s): Limited activity within patient's tolerance;Monitored during session;Premedicated before session;Ice applied    Home Living                      Prior Function            PT Goals (current goals can now be found in the care plan section) Acute Rehab PT Goals Patient Stated Goal: return to independence PT Goal Formulation: With patient Time For Goal Achievement: 01/24/17 Potential to Achieve Goals: Good Progress towards PT goals: Progressing toward goals    Frequency    7X/week      PT Plan Current plan remains appropriate    Co-evaluation              AM-PAC PT "6 Clicks" Daily Activity  Outcome Measure  Difficulty turning over in bed (including adjusting bedclothes, sheets and blankets)?: Unable Difficulty moving from lying on back to sitting on the side of the bed? : Unable Difficulty sitting down on and standing up from a chair with arms (e.g., wheelchair, bedside  commode, etc,.)?: Unable Help needed moving to and from a bed to chair (including a wheelchair)?: A Little Help needed walking in hospital room?: A Little Help needed climbing 3-5 steps with a railing? : A Little 6 Click Score: 12    End of Session Equipment Utilized During Treatment: Gait belt Activity Tolerance: Patient tolerated treatment well Patient left: with call bell/phone within reach;in bed;with bed alarm set   PT Visit Diagnosis: Difficulty in walking, not elsewhere classified (R26.2)     Time: 4098-1191 PT Time Calculation (min) (ACUTE ONLY): 18 min  Charges:  $Gait Training: 8-22 mins                    G Codes:          Heather Fitzpatrick 02/13/17, 4:01 PM

## 2017-01-21 LAB — CBC
HCT: 27.6 % — ABNORMAL LOW (ref 36.0–46.0)
HEMOGLOBIN: 9.4 g/dL — AB (ref 12.0–15.0)
MCH: 29.6 pg (ref 26.0–34.0)
MCHC: 34.1 g/dL (ref 30.0–36.0)
MCV: 86.8 fL (ref 78.0–100.0)
Platelets: 181 10*3/uL (ref 150–400)
RBC: 3.18 MIL/uL — ABNORMAL LOW (ref 3.87–5.11)
RDW: 15 % (ref 11.5–15.5)
WBC: 6.2 10*3/uL (ref 4.0–10.5)

## 2017-01-21 NOTE — Progress Notes (Signed)
Discharged from floor via w/c for transport home by car. Son & belongings with pt. No changes in assessment. Carmin Alvidrez  

## 2017-01-21 NOTE — Progress Notes (Signed)
Subjective: 2 Days Post-Op Procedure(s) (LRB): LEFT TOTAL KNEE ARTHROPLASTY (Left)  Patient reports pain as mild to moderate.  Tolerating POs well.  Admits to flatus.  Denies fever, chills, N/V, SOB, CP.  Worked well with therapy and reports that she is ready to go home.  Objective:   VITALS:  Temp:  [98.3 F (36.8 C)-98.8 F (37.1 C)] 98.8 F (37.1 C) (10/21 0604) Pulse Rate:  [72-79] 77 (10/21 0604) Resp:  [16-18] 16 (10/21 0604) BP: (105-129)/(55-66) 126/66 (10/21 0604) SpO2:  [93 %-97 %] 97 % (10/21 0604)  General: WDWN patient in NAD. Psych:  Appropriate mood and affect. Neuro:  A&O x 3, Moving all extremities, sensation intact to light touch HEENT:  EOMs intact Chest:  Even non-labored respirations Skin:  Dressing C/D/I, no rashes or lesions Extremities: warm/dry, mild edema, no visible erythema or echymosis.  No lymphadenopathy. Pulses: Dorsalis pedis and post tibialis 2+ MSK:  ROM: lacks 5 degrees of TKE, MMT: patient is able to perform quad set, (-) Homan's    LABS  Recent Labs  01/20/17 0616 01/21/17 0520  HGB 10.3* 9.4*  WBC 7.9 6.2  PLT 225 181    Recent Labs  01/20/17 0616  NA 138  K 4.0  CL 104  CO2 27  BUN 13  CREATININE 0.77  GLUCOSE 126*   No results for input(s): LABPT, INR in the last 72 hours.   Assessment/Plan: 2 Days Post-Op Procedure(s) (LRB): LEFT TOTAL KNEE ARTHROPLASTY (Left)  WBAT LLE D/C home today Scripts on chart Plan for 2 week outpatient post-op visit with Dr. Thomasena Edisollins.  Alfredo MartinezJustin Colette Dicamillo, PA-C [facility] Office:  [phone]

## 2017-01-21 NOTE — Progress Notes (Signed)
Occupational Therapy Treatment Patient Details Name: Heather Fitzpatrick MRN: [id number] DOB: 01-30-1946 Today's Date: 01/21/2017    History of present illness Pt admitted on 01/19/17 and s/p L TKA.    OT comments  Focus of session on home safety and fall prevention, tub transfer education and compensatory strategies for LB ADL. Pt verbalizing understanding of all instruction. Prepared to go home with assist of her son and friends.  Follow Up Recommendations  No OT follow up;Supervision/Assistance - 24 hour    Equipment Recommendations  None recommended by OT    Recommendations for Other Services      Precautions / Restrictions Precautions Precautions: Fall Restrictions Weight Bearing Restrictions: No Other Position/Activity Restrictions: WBAT       Mobility Bed Mobility               General bed mobility comments: Pt in chair.  Transfers   Equipment used: Rolling walker (2 wheeled) Transfers: Sit to/from Stand Sit to Stand: Supervision              Balance                                           ADL either performed or assessed with clinical judgement   ADL Overall ADL's : Needs assistance/impaired     Grooming: Wash/dry hands;Standing;Supervision/safety         Lower Body Bathing Details (indicate cue type and reason): recommended long handled bath sponge for reaching L foot, reacher for drying L foot     Lower Body Dressing: Minimal assistance;Sit to/from stand Lower Body Dressing Details (indicate cue type and reason): instructed in compensatory strategies Toilet Transfer: Supervision/safety;Ambulation;Requires wide/bariatric;BSC (over toilet)   Toileting- Clothing Manipulation and Hygiene: Supervision/safety;Sit to/from Nurse, children's Details (indicate cue type and reason): educated in technique for tub transfer, pt to practice at home with HHPT prior to attempting on her own Functional mobility during  ADLs: Supervision/safety;Rolling walker General ADL Comments: Educated in safe footwear and how to transport items safely with RW.     Vision       Perception     Praxis      Cognition Arousal/Alertness: Awake/alert Behavior During Therapy: WFL for tasks assessed/performed Overall Cognitive Status: Within Functional Limits for tasks assessed                                          Exercises     Shoulder Instructions       General Comments      Pertinent Vitals/ Pain       Pain Assessment: Faces Faces Pain Scale: Hurts little more Pain Location: L knee Pain Descriptors / Indicators: Discomfort;Sore Pain Intervention(s): Ice applied;Repositioned;Monitored during session  Home Living                                          Prior Functioning/Environment              Frequency  Min 2X/week        Progress Toward Goals  OT Goals(current goals can now be found in the care plan section)  Progress towards OT goals: Progressing toward  goals  Acute Rehab OT Goals Patient Stated Goal: return to independence OT Goal Formulation: With patient Time For Goal Achievement: 01/27/17 Potential to Achieve Goals: Good  Plan Discharge plan remains appropriate    Co-evaluation                 AM-PAC PT "6 Clicks" Daily Activity     Outcome Measure   Help from another person eating meals?: None Help from another person taking care of personal grooming?: A Little Help from another person toileting, which includes using toliet, bedpan, or urinal?: A Little Help from another person bathing (including washing, rinsing, drying)?: A Little Help from another person to put on and taking off regular upper body clothing?: None Help from another person to put on and taking off regular lower body clothing?: A Little 6 Click Score: 20    End of Session Equipment Utilized During Treatment: Rolling walker;Gait belt  OT Visit  Diagnosis: Unsteadiness on feet (R26.81);Muscle weakness (generalized) (M62.81)   Activity Tolerance Patient tolerated treatment well   Patient Left in chair;with call bell/phone within reach   Nurse Communication          Time: 1610-96041026-1045 OT Time Calculation (min): 19 min  Charges: OT General Charges $OT Visit: 1 Visit OT Treatments $Self Care/Home Management : 8-22 mins   Evern BioMayberry, Heather Fitzpatrick 01/21/2017, 10:55 AM 01/21/2017 Martie RoundJulie Mitzy Fitzpatrick, OTR/L Pager: [phone]

## 2017-01-21 NOTE — Progress Notes (Signed)
Physical Therapy Treatment Patient Details Name: Heather BlackerCaroline C Fitzpatrick MRN: [id number] DOB: 1946-04-03 Today's Date: 01/21/2017    History of Present Illness Pt admitted on 01/19/17 and s/p L TKA.     PT Comments    Pt progressing well; all questions addressed regarding mobility; reviewed HEP and stairs, pt able to return demo; very motivated  Follow Up Recommendations  DC plan and follow up therapy as arranged by surgeon     Equipment Recommendations  None recommended by PT    Recommendations for Other Services       Precautions / Restrictions Precautions Precautions: Fall Precaution Comments: IND SLRs Restrictions Weight Bearing Restrictions: No Other Position/Activity Restrictions: WBAT    Mobility  Bed Mobility               General bed mobility comments: Pt in chair.  Transfers Overall transfer level: Needs assistance Equipment used: Rolling walker (2 wheeled) Transfers: Sit to/from Stand Sit to Stand: Supervision         General transfer comment: cues for hand placement and LE management.  Ambulation/Gait Ambulation/Gait assistance: Supervision Ambulation Distance (Feet): 120 Feet Assistive device: Rolling walker (2 wheeled) Gait Pattern/deviations: Step-to pattern;Step-through pattern     General Gait Details: cues for posture and RW position from self; supervision for safety adn obstacle negotiation   Stairs Stairs: Yes   Stair Management: One rail Left;Step to pattern;Forwards Number of Stairs: 3 General stair comments: cues for technique and sequence  Wheelchair Mobility    Modified Rankin (Stroke Patients Only)       Balance                                            Cognition Arousal/Alertness: Awake/alert Behavior During Therapy: WFL for tasks assessed/performed Overall Cognitive Status: Within Functional Limits for tasks assessed                                        Exercises  Total Joint Exercises Ankle Circles/Pumps: AROM;Both;10 reps Quad Sets: 10 reps;Both;AROM Short Arc Quad: AROM;10 reps;Left Heel Slides: AAROM;Left;10 reps Hip ABduction/ADduction: AROM;Left;10 reps Straight Leg Raises: Strengthening;Left;10 reps;AAROM    General Comments        Pertinent Vitals/Pain Pain Assessment: Faces Pain Score: 5  Faces Pain Scale: Hurts little more Pain Location: L knee Pain Descriptors / Indicators: Discomfort;Sore Pain Intervention(s): Limited activity within patient's tolerance;Monitored during session;Premedicated before session;Ice applied    Home Living                      Prior Function            PT Goals (current goals can now be found in the care plan section) Acute Rehab PT Goals Patient Stated Goal: return to independence    Frequency    7X/week      PT Plan Current plan remains appropriate    Co-evaluation              AM-PAC PT "6 Clicks" Daily Activity  Outcome Measure  Difficulty turning over in bed (including adjusting bedclothes, sheets and blankets)?: A Little Difficulty moving from lying on back to sitting on the side of the bed? : A Little Difficulty sitting down on and standing up from a chair with arms (  e.g., wheelchair, bedside commode, etc,.)?: A Little Help needed moving to and from a bed to chair (including a wheelchair)?: A Little Help needed walking in hospital room?: A Little Help needed climbing 3-5 steps with a railing? : A Little 6 Click Score: 18    End of Session Equipment Utilized During Treatment: Gait belt Activity Tolerance: Patient tolerated treatment well Patient left: with call bell/phone within reach;in chair;with nursing/sitter in room   PT Visit Diagnosis: Difficulty in walking, not elsewhere classified (R26.2)     Time: 1610-9604 PT Time Calculation (min) (ACUTE ONLY): 25 min  Charges:  $Gait Training: 23-37 mins                    G CodesDrucilla Chalet,  PT Pager: [phone] 01/21/2017'   Drucilla Chalet 01/21/2017, 11:53 AM

## 2017-01-22 ENCOUNTER — Encounter (HOSPITAL_COMMUNITY): Payer: Self-pay | Admitting: Specialist

## 2017-01-22 NOTE — Discharge Summary (Signed)
Physician Discharge Summary  Patient ID: Heather Fitzpatrick MRN: [id number] DOB/AGE: 1945-09-13 71 y.o.  Admit date: 01/19/2017 Discharge date: 01/21/17  Admission Diagnoses:  Discharge Diagnoses:  Active Problems:   S/P knee replacement   Discharged Condition: good  Hospital Course:  Heather Fitzpatrick is a 71 y.o. who was admitted to [facility]. They were brought to the operating room on 01/19/2017 and underwent Procedure(s): LEFT TOTAL KNEE ARTHROPLASTY.  Patient tolerated the procedure well and was later transferred to the recovery room and then to the orthopaedic floor for postoperative care.  They were given PO and IV analgesics for pain control following their surgery.  They were given 24 hours of postoperative antibiotics of  Anti-infectives    Start     Dose/Rate Route Frequency Ordered Stop   01/20/17 0600  ceFAZolin (ANCEF) IVPB 2g/100 mL premix     2 g 200 mL/hr over 30 Minutes Intravenous On call to O.R. 01/19/17 1312 01/19/17 1628   01/19/17 2200  ceFAZolin (ANCEF) IVPB 2g/100 mL premix     2 g 200 mL/hr over 30 Minutes Intravenous Every 6 hours 01/19/17 1940 01/20/17 0413     and started on DVT prophylaxis in the form of Lovenox.   PT and OT were ordered for total joint protocol.  Discharge planning consulted to help with postop disposition and equipment needs.  Patient had a good night on the evening of surgery and started to get up OOB with therapy on day one.  Hemovac drain was pulled without difficulty.  Continued to work with therapy into day two.  Dressing was with normal limits.  The patient had progressed with therapy and meeting their goals. Patient was seen in rounds and was ready to go home.  Consults: n/a Significant Diagnostic Studies:routine  Treatments: routine  Discharge Exam: Blood pressure 126/66, pulse 77, temperature 98.8 F (37.1 C), temperature source Oral, resp. rate 16, height 5' 6.5" (1.689 m), weight 77.1 kg (170 lb), SpO2 97  %. Alert and oriented x3. RRR, Lungs clear, BS x4. Left Calf soft and non tender. L knee dressing C/D/I. No DVT signs. No signs of infection or compartment syndrome. LLE grossly neurovascularly intact.  Disposition: 01-Home or Self Care  Discharge Instructions    Call MD / Call 911    Complete by:  As directed    If you experience chest pain or shortness of breath, CALL 911 and be transported to the hospital emergency room.  If you develope a fever above 101 F, pus (white drainage) or increased drainage or redness at the wound, or calf pain, call your surgeon's office.   Call MD / Call 911    Complete by:  As directed    If you experience chest pain or shortness of breath, CALL 911 and be transported to the hospital emergency room.  If you develope a fever above 101 F, pus (white drainage) or increased drainage or redness at the wound, or calf pain, call your surgeon's office.   Constipation Prevention    Complete by:  As directed    Drink plenty of fluids.  Prune juice may be helpful.  You may use a stool softener, such as Colace (over the counter) 100 mg twice a day.  Use MiraLax (over the counter) for constipation as needed.   Constipation Prevention    Complete by:  As directed    Drink plenty of fluids.  Prune juice may be helpful.  You may use a stool softener, such  as Colace (over the counter) 100 mg twice a day.  Use MiraLax (over the counter) for constipation as needed.   Diet - low sodium heart healthy    Complete by:  As directed    Diet - low sodium heart healthy    Complete by:  As directed    Discharge instructions    Complete by:  As directed    INSTRUCTIONS AFTER JOINT REPLACEMENT   Remove items at home which could result in a fall. This includes throw rugs or furniture in walking pathways ICE to the affected joint every three hours while awake for 30 minutes at a time, for at least the first 3-5 days, and then as needed for pain and swelling.  Continue to use ice for pain  and swelling. You may notice swelling that will progress down to the foot and ankle.  This is normal after surgery.  Elevate your leg when you are not up walking on it.   Continue to use the breathing machine you got in the hospital (incentive spirometer) which will help keep your temperature down.  It is common for your temperature to cycle up and down following surgery, especially at night when you are not up moving around and exerting yourself.  The breathing machine keeps your lungs expanded and your temperature down.   DIET:  As you were doing prior to hospitalization, we recommend a well-balanced diet.  DRESSING / WOUND CARE / SHOWERING  Keep the surgical dressing until follow up.  The dressing is water proof, so you can shower without any extra covering.  IF THE DRESSING FALLS OFF or the wound gets wet inside, change the dressing with sterile gauze.  Please use good hand washing techniques before changing the dressing.  Do not use any lotions or creams on the incision until instructed by your surgeon.    ACTIVITY  Increase activity slowly as tolerated, but follow the weight bearing instructions below.   No driving for 6 weeks or until further direction given by your physician.  You cannot drive while taking narcotics.  No lifting or carrying greater than 10 lbs. until further directed by your surgeon. Avoid periods of inactivity such as sitting longer than an hour when not asleep. This helps prevent blood clots.  You may return to work once you are authorized by your doctor.     WEIGHT BEARING   Weight bearing as tolerated with assist device (walker, cane, etc) as directed, use it as long as suggested by your surgeon or therapist, typically at least 4-6 weeks.   EXERCISES  Results after joint replacement surgery are often greatly improved when you follow the exercise, range of motion and muscle strengthening exercises prescribed by your doctor. Safety measures are also important to  protect the joint from further injury. Any time any of these exercises cause you to have increased pain or swelling, decrease what you are doing until you are comfortable again and then slowly increase them. If you have problems or questions, call your caregiver or physical therapist for advice.   Rehabilitation is important following a joint replacement. After just a few days of immobilization, the muscles of the leg can become weakened and shrink (atrophy).  These exercises are designed to build up the tone and strength of the thigh and leg muscles and to improve motion. Often times heat used for twenty to thirty minutes before working out will loosen up your tissues and help with improving the range of motion but do  not use heat for the first two weeks following surgery (sometimes heat can increase post-operative swelling).   These exercises can be done on a training (exercise) mat, on the floor, on a table or on a bed. Use whatever works the best and is most comfortable for you.    Use music or television while you are exercising so that the exercises are a pleasant break in your day. This will make your life better with the exercises acting as a break in your routine that you can look forward to.   Perform all exercises about fifteen times, three times per day or as directed.  You should exercise both the operative leg and the other leg as well.   Exercises include:   Quad Sets - Tighten up the muscle on the front of the thigh (Quad) and hold for 5-10 seconds.   Straight Leg Raises - With your knee straight (if you were given a brace, keep it on), lift the leg to 60 degrees, hold for 3 seconds, and slowly lower the leg.  Perform this exercise against resistance later as your leg gets stronger.  Leg Slides: Lying on your back, slowly slide your foot toward your buttocks, bending your knee up off the floor (only go as far as is comfortable). Then slowly slide your foot back down until your leg is flat  on the floor again.  Angel Wings: Lying on your back spread your legs to the side as far apart as you can without causing discomfort.  Hamstring Strength:  Lying on your back, push your heel against the floor with your leg straight by tightening up the muscles of your buttocks.  Repeat, but this time bend your knee to a comfortable angle, and push your heel against the floor.  You may put a pillow under the heel to make it more comfortable if necessary.   A rehabilitation program following joint replacement surgery can speed recovery and prevent re-injury in the future due to weakened muscles. Contact your doctor or a physical therapist for more information on knee rehabilitation.    CONSTIPATION  Constipation is defined medically as fewer than three stools per week and severe constipation as less than one stool per week.  Even if you have a regular bowel pattern at home, your normal regimen is likely to be disrupted due to multiple reasons following surgery.  Combination of anesthesia, postoperative narcotics, change in appetite and fluid intake all can affect your bowels.   YOU MUST use at least one of the following options; they are listed in order of increasing strength to get the job done.  They are all available over the counter, and you may need to use some, POSSIBLY even all of these options:    Drink plenty of fluids (prune juice may be helpful) and high fiber foods Colace 100 mg by mouth twice a day  Senokot for constipation as directed and as needed Dulcolax (bisacodyl), take with full glass of water  Miralax (polyethylene glycol) once or twice a day as needed.  If you have tried all these things and are unable to have a bowel movement in the first 3-4 days after surgery call either your surgeon or your primary doctor.    If you experience loose stools or diarrhea, hold the medications until you stool forms back up.  If your symptoms do not get better within 1 week or if they get  worse, check with your doctor.  If you experience "the worst abdominal  pain ever" or develop nausea or vomiting, please contact the office immediately for further recommendations for treatment.   ITCHING:  If you experience itching with your medications, try taking only a single pain pill, or even half a pain pill at a time.  You can also use Benadryl over the counter for itching or also to help with sleep.   TED HOSE STOCKINGS:  Use stockings on both legs until for at least 2 weeks or as directed by physician office. They may be removed at night for sleeping.  MEDICATIONS:  See your medication summary on the "After Visit Summary" that nursing will review with you.  You may have some home medications which will be placed on hold until you complete the course of blood thinner medication.  It is important for you to complete the blood thinner medication as prescribed.  PRECAUTIONS:  If you experience chest pain or shortness of breath - call 911 immediately for transfer to the hospital emergency department.   If you develop a fever greater that 101 F, purulent drainage from wound, increased redness or drainage from wound, foul odor from the wound/dressing, or calf pain - CONTACT YOUR SURGEON.                                                   FOLLOW-UP APPOINTMENTS:  If you do not already have a post-op appointment, please call the office for an appointment to be seen by your surgeon.  Guidelines for how soon to be seen are listed in your "After Visit Summary", but are typically between 1-4 weeks after surgery.  OTHER INSTRUCTIONS:   Knee Replacement:  Do not place pillow under knee, focus on keeping the knee straight while resting. CPM instructions: 0-90 degrees, 2 hours in the morning, 2 hours in the afternoon, and 2 hours in the evening. Place foam block, curve side up under heel at all times except when in CPM or when walking.  DO NOT modify, tear, cut, or change the foam block in any way.  MAKE  SURE YOU:  Understand these instructions.  Get help right away if you are not doing well or get worse.    Thank you for letting us be a part of your medical care team.  It is a privilege we respect greatly.  We hope these instructions will help you stay on track for a fast and full recovery!   Increase activity slowly as tolerated    Complete by:  As directed    Increase activity slowly as tolerated    Complete by:  As directed      Allergies as of 01/21/2017      Reactions   Statins Other (See Comments)   Severe muscle aches      Medication List    STOP taking these medications   ibuprofen 200 MG tablet Commonly known as:  ADVIL,MOTRIN     TAKE these medications   acetaminophen 500 MG tablet Commonly known as:  TYLENOL Take 1,000 mg by mouth every 6 (six) hours as needed for moderate pain or headache.   aspirin EC 325 MG tablet Take 1 tablet (325 mg total) by mouth 2 (two) times daily.   baclofen 10 MG tablet Commonly known as:  LIORESAL Take 1 tablet (10 mg total) by mouth 3 (three) times daily as needed for muscle  spasms.   Fish Oil 1000 MG Caps Take 1,000 mg by mouth daily.   HM VITAMIN D3 2000 units Caps Generic drug:  Cholecalciferol Take 4,000 Units by mouth daily.   MULTIVITAMIN PO Take 1 tablet by mouth daily.   oxyCODONE 5 MG immediate release tablet Commonly known as:  ROXICODONE Take 1 tablet (5 mg total) by mouth every 4 (four) hours as needed for severe pain.   PROAIR HFA 108 (90 Base) MCG/ACT inhaler Generic drug:  albuterol Inhale 1-2 puffs into the lungs every 4 (four) hours as needed for shortness of breath or wheezing.      Follow-up Information    [facility] Follow up.   Specialty:  Home Health Services Why:  Home Health Physical Therapy- agency will call to arrange initial visit Contact information: [address] [place] [address] [phone]        Eugenia Mcalpine, MD. Schedule an appointment as soon as  possible for a visit in 2 week(s).   Specialty:  Orthopedic Surgery Contact information: [address] [place] [address] [phone]           Signed: Markham Jordan 01/22/2017, 1:13 PM

## 2019-04-28 ENCOUNTER — Ambulatory Visit: Payer: Medicare PPO | Attending: Internal Medicine

## 2019-04-28 DIAGNOSIS — Z23 Encounter for immunization: Secondary | ICD-10-CM | POA: Insufficient documentation

## 2019-04-28 NOTE — Progress Notes (Signed)
   Covid-19 Vaccination Clinic  Name:  WILDA WETHERELL    MRN: [id number] DOB: 01-05-1946  04/28/2019  Ms. Dubow was observed post Covid-19 immunization for 15 minutes without incidence. She was provided with Vaccine Information Sheet and instruction to access the V-Safe system.   Ms. Angell was instructed to call 911 with any severe reactions post vaccine: Marland Kitchen Difficulty breathing  . Swelling of your face and throat  . A fast heartbeat  . A bad rash all over your body  . Dizziness and weakness    Immunizations Administered    Name Date Dose VIS Date Route   Pfizer COVID-19 Vaccine 04/28/2019  5:40 PM 0.3 mL 03/14/2019 Intramuscular   Manufacturer: [employer], [employer]   Lot: [id number]   NDC: [address]-2

## 2019-05-19 ENCOUNTER — Ambulatory Visit: Payer: Medicare PPO | Attending: Internal Medicine

## 2019-05-19 DIAGNOSIS — Z23 Encounter for immunization: Secondary | ICD-10-CM | POA: Insufficient documentation

## 2019-05-19 NOTE — Progress Notes (Signed)
   Covid-19 Vaccination Clinic  Name:  Heather Fitzpatrick    MRN: [id number] DOB: February 06, 1946  05/19/2019  Heather Fitzpatrick was observed post Covid-19 immunization for 15 minutes without incidence. She was provided with Vaccine Information Sheet and instruction to access the V-Safe system.   Heather Fitzpatrick was instructed to call 911 with any severe reactions post vaccine: Marland Kitchen Difficulty breathing  . Swelling of your face and throat  . A fast heartbeat  . A bad rash all over your body  . Dizziness and weakness    Immunizations Administered    Name Date Dose VIS Date Route   Pfizer COVID-19 Vaccine 05/19/2019  1:07 PM 0.3 mL 03/14/2019 Intramuscular   Manufacturer: [employer], [employer]   Lot: [id number]   NDC: [address]-9

## 2019-07-30 ENCOUNTER — Other Ambulatory Visit: Payer: Self-pay | Admitting: Family Medicine

## 2019-07-30 DIAGNOSIS — M8580 Other specified disorders of bone density and structure, unspecified site: Secondary | ICD-10-CM

## 2019-07-31 ENCOUNTER — Other Ambulatory Visit: Payer: Self-pay | Admitting: Family Medicine

## 2019-07-31 DIAGNOSIS — E049 Nontoxic goiter, unspecified: Secondary | ICD-10-CM

## 2019-08-11 ENCOUNTER — Ambulatory Visit
Admission: RE | Admit: 2019-08-11 | Discharge: 2019-08-11 | Disposition: A | Discharge status: Home / Self Care | Payer: Medicare PPO | Source: Ambulatory Visit | Attending: Family Medicine | Admitting: Family Medicine

## 2019-08-11 DIAGNOSIS — E049 Nontoxic goiter, unspecified: Secondary | ICD-10-CM

## 2019-10-08 ENCOUNTER — Other Ambulatory Visit: Payer: Medicare PPO

## 2019-10-15 DIAGNOSIS — D485 Neoplasm of uncertain behavior of skin: Secondary | ICD-10-CM | POA: Diagnosis not present

## 2019-10-15 DIAGNOSIS — D2262 Melanocytic nevi of left upper limb, including shoulder: Secondary | ICD-10-CM | POA: Diagnosis not present

## 2019-10-15 DIAGNOSIS — D2272 Melanocytic nevi of left lower limb, including hip: Secondary | ICD-10-CM | POA: Diagnosis not present

## 2019-10-15 DIAGNOSIS — D225 Melanocytic nevi of trunk: Secondary | ICD-10-CM | POA: Diagnosis not present

## 2019-10-15 DIAGNOSIS — L821 Other seborrheic keratosis: Secondary | ICD-10-CM | POA: Diagnosis not present

## 2019-10-15 DIAGNOSIS — D2261 Melanocytic nevi of right upper limb, including shoulder: Secondary | ICD-10-CM | POA: Diagnosis not present

## 2019-12-10 DIAGNOSIS — H353132 Nonexudative age-related macular degeneration, bilateral, intermediate dry stage: Secondary | ICD-10-CM | POA: Diagnosis not present

## 2019-12-10 DIAGNOSIS — H52203 Unspecified astigmatism, bilateral: Secondary | ICD-10-CM | POA: Diagnosis not present

## 2019-12-10 DIAGNOSIS — H26493 Other secondary cataract, bilateral: Secondary | ICD-10-CM | POA: Diagnosis not present

## 2020-01-23 ENCOUNTER — Ambulatory Visit: Payer: Medicare PPO | Attending: Internal Medicine

## 2020-01-23 DIAGNOSIS — Z23 Encounter for immunization: Secondary | ICD-10-CM

## 2020-01-23 NOTE — Progress Notes (Signed)
   Covid-19 Vaccination Clinic  Name:  Heather Fitzpatrick    MRN: [id number] DOB: Dec 03, 1945  01/23/2020  Heather Fitzpatrick was observed post Covid-19 immunization for 15 minutes without incident. She was provided with Vaccine Information Sheet and instruction to access the V-Safe system.   Heather Fitzpatrick was instructed to call 911 with any severe reactions post vaccine: Marland Kitchen Difficulty breathing  . Swelling of face and throat  . A fast heartbeat  . A bad rash all over body  . Dizziness and weakness

## 2020-02-05 DIAGNOSIS — H26491 Other secondary cataract, right eye: Secondary | ICD-10-CM | POA: Diagnosis not present

## 2020-02-19 DIAGNOSIS — H26492 Other secondary cataract, left eye: Secondary | ICD-10-CM | POA: Diagnosis not present

## 2020-06-01 DIAGNOSIS — Z1231 Encounter for screening mammogram for malignant neoplasm of breast: Secondary | ICD-10-CM | POA: Diagnosis not present

## 2020-08-09 DIAGNOSIS — E049 Nontoxic goiter, unspecified: Secondary | ICD-10-CM | POA: Diagnosis not present

## 2020-08-09 DIAGNOSIS — F439 Reaction to severe stress, unspecified: Secondary | ICD-10-CM | POA: Diagnosis not present

## 2020-08-09 DIAGNOSIS — E559 Vitamin D deficiency, unspecified: Secondary | ICD-10-CM | POA: Diagnosis not present

## 2020-08-09 DIAGNOSIS — E785 Hyperlipidemia, unspecified: Secondary | ICD-10-CM | POA: Diagnosis not present

## 2020-08-09 DIAGNOSIS — Z0000 Encounter for general adult medical examination without abnormal findings: Secondary | ICD-10-CM | POA: Diagnosis not present

## 2020-08-09 DIAGNOSIS — M8588 Other specified disorders of bone density and structure, other site: Secondary | ICD-10-CM | POA: Diagnosis not present

## 2020-08-10 ENCOUNTER — Other Ambulatory Visit: Payer: Self-pay | Admitting: Family Medicine

## 2020-08-10 DIAGNOSIS — E049 Nontoxic goiter, unspecified: Secondary | ICD-10-CM

## 2020-08-24 ENCOUNTER — Ambulatory Visit
Admission: RE | Admit: 2020-08-24 | Discharge: 2020-08-24 | Disposition: A | Discharge status: Home / Self Care | Payer: Medicare PPO | Source: Ambulatory Visit | Attending: Family Medicine | Admitting: Family Medicine

## 2020-08-24 DIAGNOSIS — E042 Nontoxic multinodular goiter: Secondary | ICD-10-CM | POA: Diagnosis not present

## 2020-08-24 DIAGNOSIS — E049 Nontoxic goiter, unspecified: Secondary | ICD-10-CM

## 2020-09-01 ENCOUNTER — Ambulatory Visit: Payer: Medicare PPO

## 2020-09-01 NOTE — Progress Notes (Signed)
   Covid-19 Vaccination Clinic  Name:  LAELA DEVINEY    MRN: [id number] DOB: Dec 25, 1945  09/01/2020  Ms. Shellhammer was observed post Covid-19 immunization for 15 minutes without incident. She was provided with Vaccine Information Sheet and instruction to access the V-Safe system.   Ms. Olsen was instructed to call 911 with any severe reactions post vaccine: Marland Kitchen Difficulty breathing  . Swelling of face and throat  . A fast heartbeat  . A bad rash all over body  . Dizziness and weakness

## 2020-09-06 ENCOUNTER — Other Ambulatory Visit (HOSPITAL_BASED_OUTPATIENT_CLINIC_OR_DEPARTMENT_OTHER): Payer: Self-pay

## 2020-09-06 MED ORDER — PFIZER-BIONT COVID-19 VAC-TRIS 30 MCG/0.3ML IM SUSP
INTRAMUSCULAR | 0 refills | Status: DC
  Filled 2020-09-06: qty 0.3, 1d supply, fill #0

## 2020-10-23 IMAGING — US US THYROID
1 series · 13 of 25 positions shown · non-contrast
Comparison: Ultrasound dated 11/06/2011.

CLINICAL DATA: Goiter follow-up

EXAM:
THYROID ULTRASOUND
TECHNIQUE: Ultrasound examination of the thyroid gland and adjacent soft
tissues was performed.

[Series 1: us thyroid · 0.06mm/px · 13 of 51 slices shown]
[im 1/51]
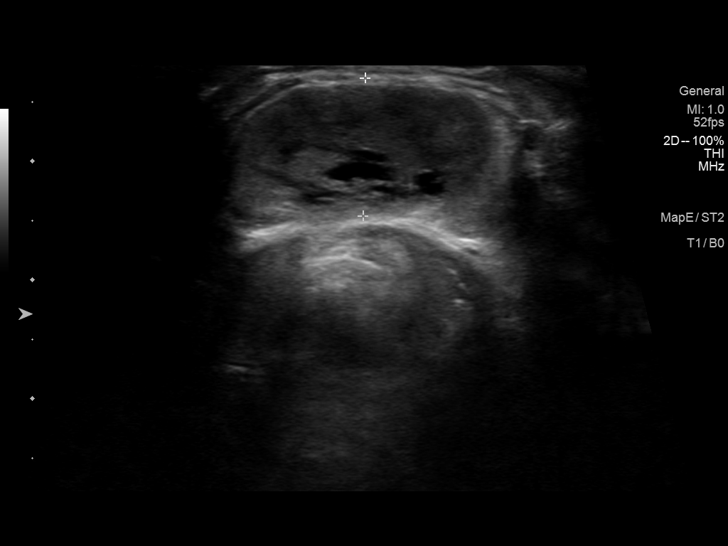
[im 5/51]
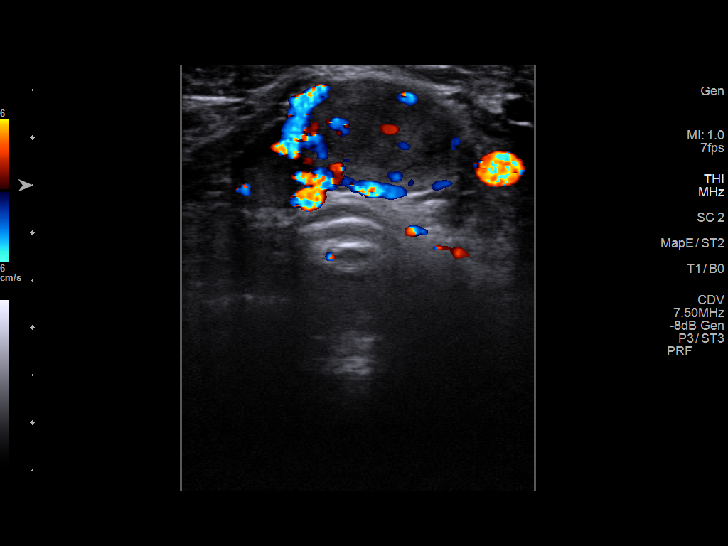
[im 9/51]
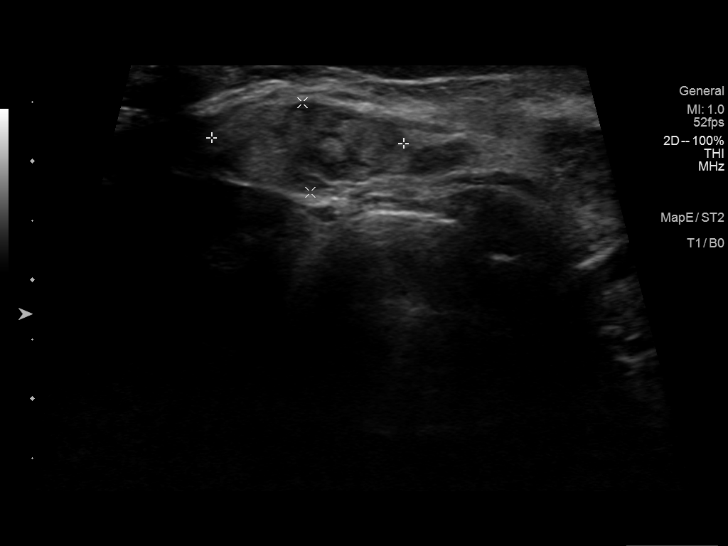
[im 13/51]
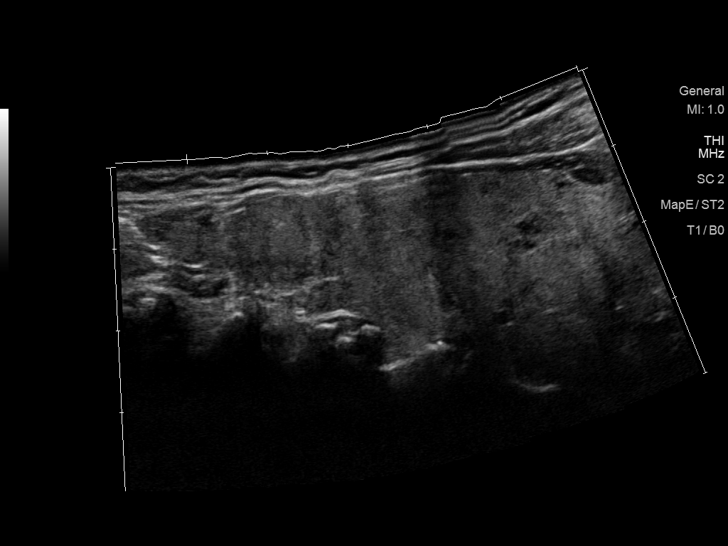
[im 17/51]
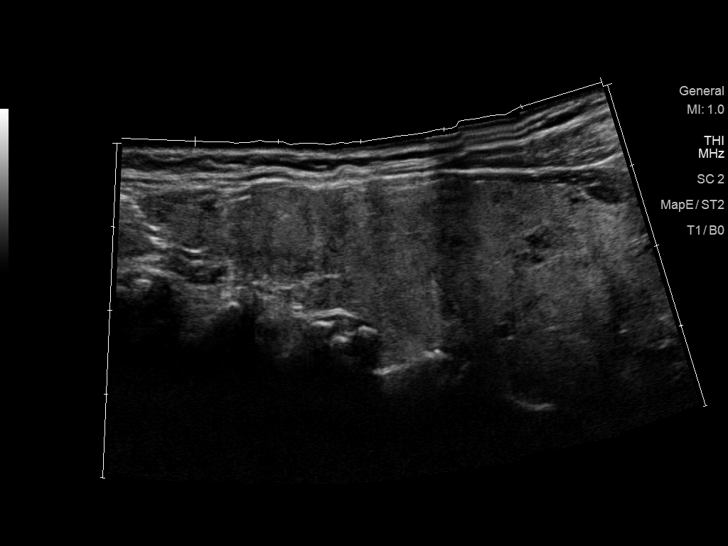
[im 21/51]
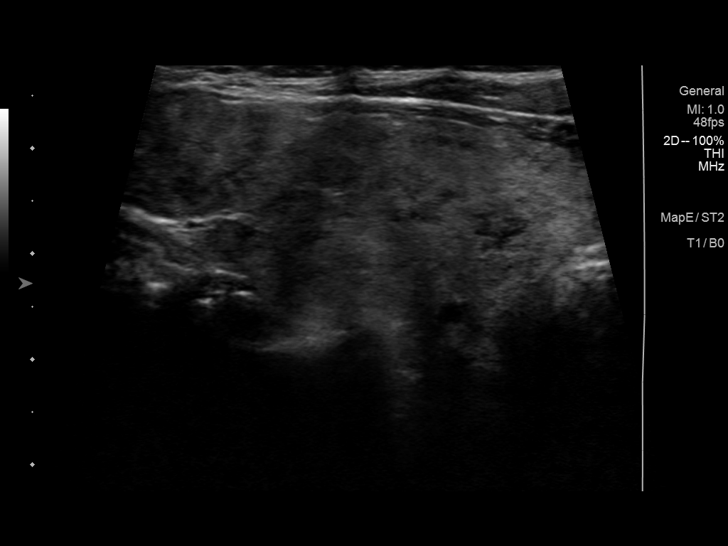
[im 26/51]
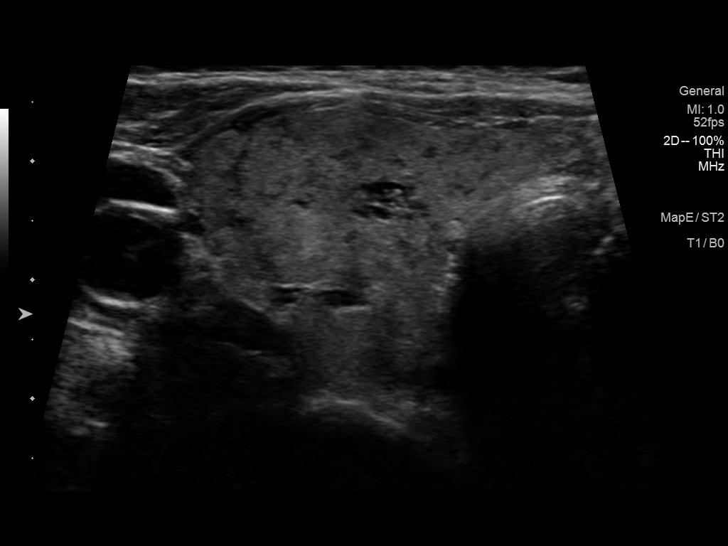
[im 30/51]
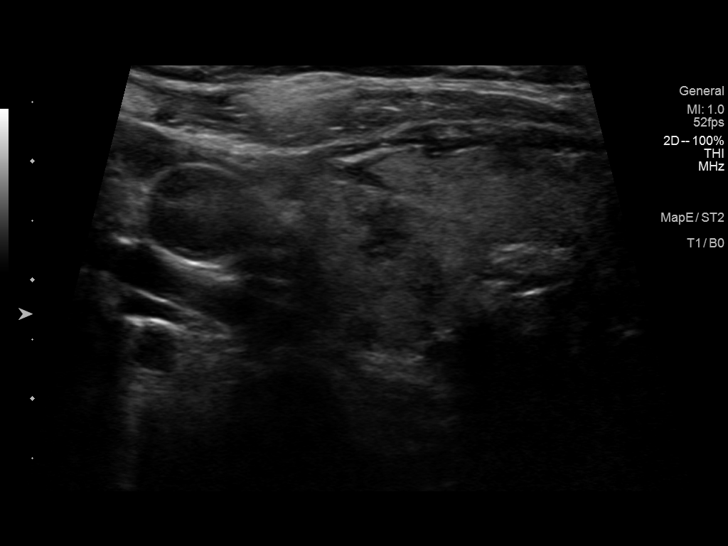
[im 34/51]
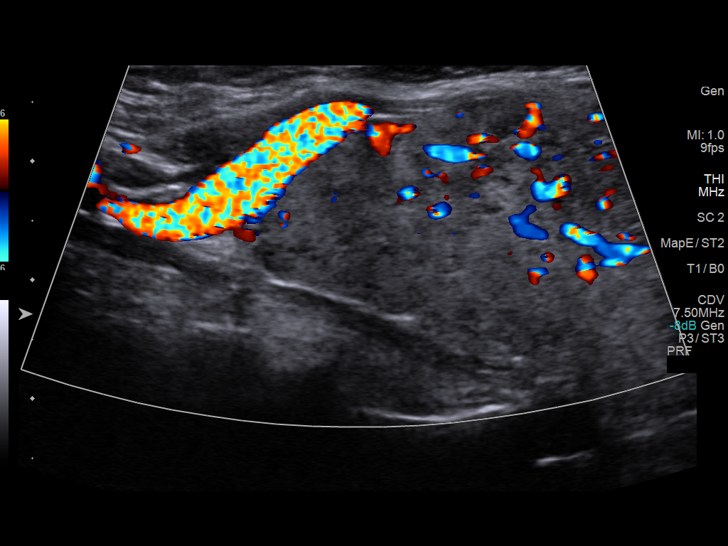
[im 38/51]
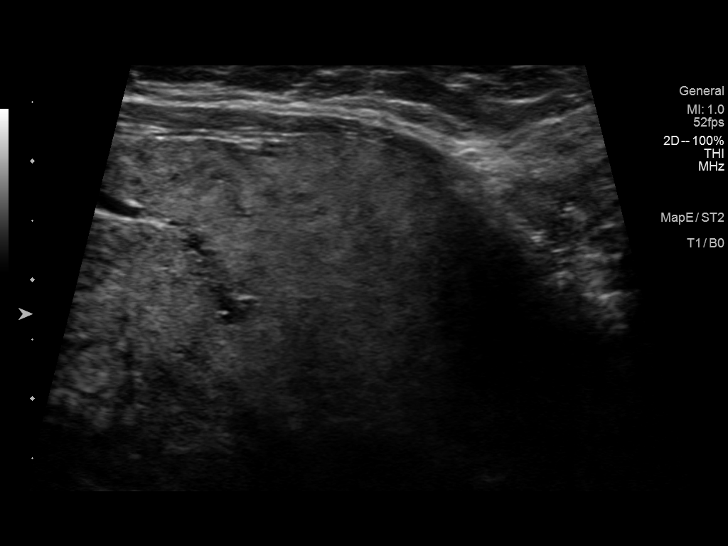
[im 42/51]
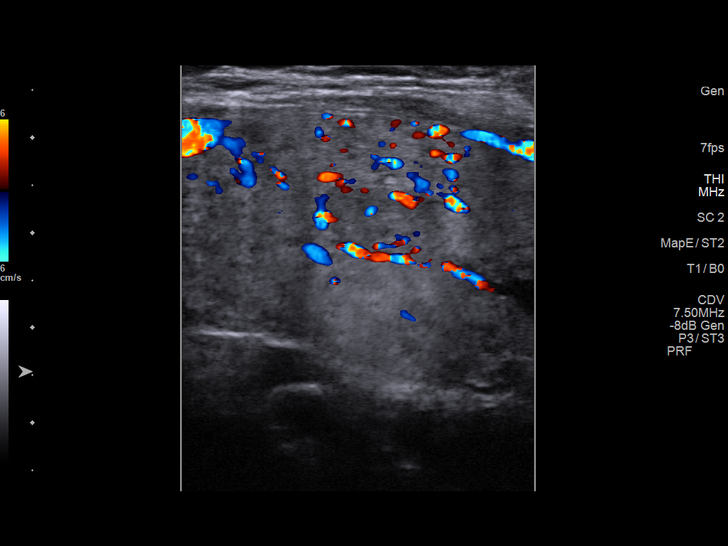
[im 46/51]
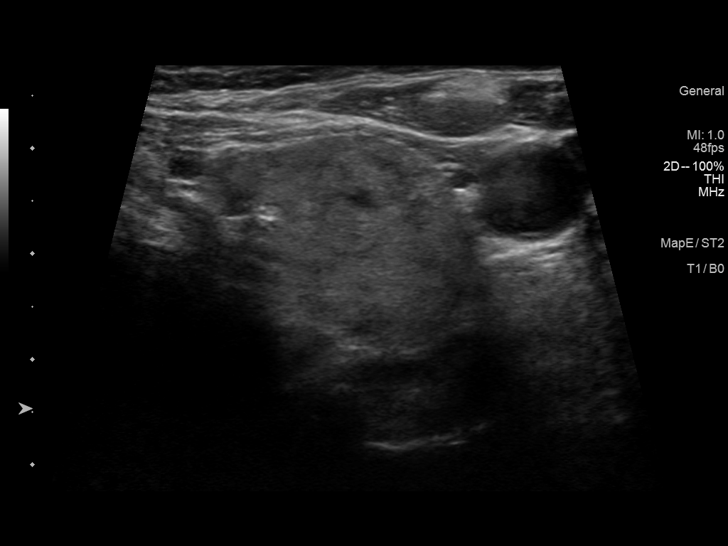
[im 51/51]
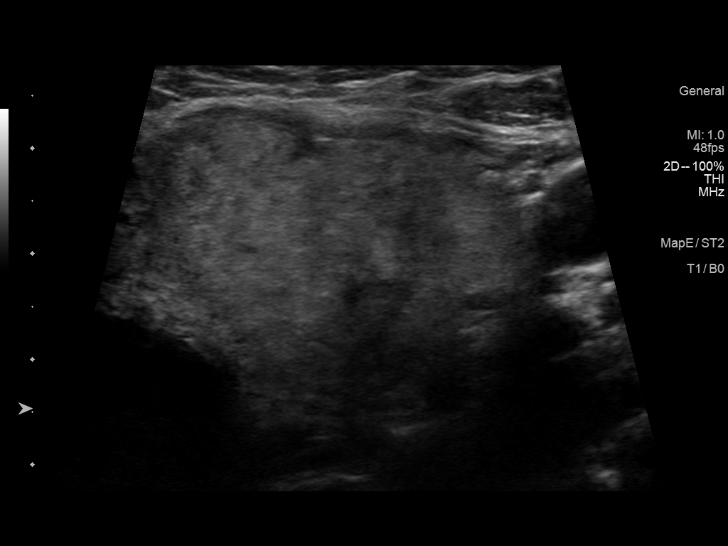

[13 of 25 positions shown; findings below may reference images not displayed]

FINDINGS: Parenchymal Echotexture: Moderately heterogenous

Isthmus: 1.2 cm

Right lobe: 6.6 x 2.4 x 2.6 cm

Left lobe:  6 x 2.8 x 3.4 cm

_________________________________________________________

Estimated total number of nodules >/= 1 cm: 2

Number of spongiform nodules >/=  2 cm not described below (TR1): 0

Number of mixed cystic and solid nodules >/= 1.5 cm not described
below (TR2): 0

_________________________________________________________

Nodule # 1:

Prior biopsy: Yes

Location: Isthmus; Superior

Maximum size: 3.6 cm; Other 2 dimensions: 1.7 x 3.3 cm, previously,
2.7 x 1.1 x 2.5 cm

Composition: solid/almost completely solid (2)

Echogenicity: hypoechoic (2)

Shape: not taller-than-wide (0)

Margins: smooth (0)

Echogenic foci: none (0)

ACR TI-RADS total points: 4.

ACR TI-RADS risk category:  TR4 (4-6 points).

Significant change in size (>/= 20% in two dimensions and minimal
increase of 2 mm): Yes

Change in features: No

Change in ACR TI-RADS risk category: No

ACR TI-RADS recommendations:

This thyroid nodule was previously biopsied.

_________________________________________________________

Nodule # 2:

Prior biopsy: No

Location: Isthmus; Superior

Maximum size: 1.7 cm; Other 2 dimensions: 0.8 x 1.6 cm, previously,
1.9 x 0.8 x 2.1 cm

Composition: solid/almost completely solid (2)

Echogenicity: isoechoic (1)

Shape: not taller-than-wide (0)

Margins: ill-defined (0)

Echogenic foci: none (0)

ACR TI-RADS total points: 3.

ACR TI-RADS risk category:  TR3 (3 points).

Significant change in size (>/= 20% in two dimensions and minimal
increase of 2 mm): No

Change in features: No

Change in ACR TI-RADS risk category: No

ACR TI-RADS recommendations:

This thyroid nodule was previously biopsied.

_________________________________________________________

The inferior aspects of the left and right thyroid glands terminate
below the sternum and therefore are suboptimally evaluated
IMPRESSION: 1. Multinodular goiter as detailed above.
2. There has been significant interval growth of the dominant
thyroid nodule in the isthmus, now measuring 3.6 cm (previously
measuring 2.7 cm). This thyroid nodule was previously biopsied.
Correlation with prior biopsy results is recommended. Consider a 1
year follow-up ultrasound or repeat FNA as clinically indicated.
3. Stable 1.7 cm thyroid nodule in the isthmus. This thyroid nodule
was previously biopsied.

The above is in keeping with the ACR TI-RADS recommendations - [HOSPITAL] 4543;[DATE].

## 2020-11-08 DIAGNOSIS — D2272 Melanocytic nevi of left lower limb, including hip: Secondary | ICD-10-CM | POA: Diagnosis not present

## 2020-11-08 DIAGNOSIS — D1801 Hemangioma of skin and subcutaneous tissue: Secondary | ICD-10-CM | POA: Diagnosis not present

## 2020-11-08 DIAGNOSIS — D2271 Melanocytic nevi of right lower limb, including hip: Secondary | ICD-10-CM | POA: Diagnosis not present

## 2020-11-08 DIAGNOSIS — D224 Melanocytic nevi of scalp and neck: Secondary | ICD-10-CM | POA: Diagnosis not present

## 2020-11-08 DIAGNOSIS — D225 Melanocytic nevi of trunk: Secondary | ICD-10-CM | POA: Diagnosis not present

## 2020-11-08 DIAGNOSIS — L821 Other seborrheic keratosis: Secondary | ICD-10-CM | POA: Diagnosis not present

## 2020-11-08 DIAGNOSIS — D2261 Melanocytic nevi of right upper limb, including shoulder: Secondary | ICD-10-CM | POA: Diagnosis not present

## 2020-12-21 ENCOUNTER — Ambulatory Visit: Payer: Medicare PPO | Attending: Internal Medicine

## 2020-12-21 DIAGNOSIS — Z23 Encounter for immunization: Secondary | ICD-10-CM

## 2020-12-21 NOTE — Progress Notes (Signed)
   Covid-19 Vaccination Clinic  Name:  Heather Fitzpatrick    MRN: [id number] DOB: 04-03-1946  12/21/2020  Heather Fitzpatrick was observed post Covid-19 immunization for 15 minutes without incident. She was provided with Vaccine Information Sheet and instruction to access the V-Safe system.   Heather Fitzpatrick was instructed to call 911 with any severe reactions post vaccine: Difficulty breathing  Swelling of face and throat  A fast heartbeat  A bad rash all over body  Dizziness and weakness

## 2020-12-28 ENCOUNTER — Other Ambulatory Visit (HOSPITAL_BASED_OUTPATIENT_CLINIC_OR_DEPARTMENT_OTHER): Payer: Self-pay

## 2020-12-28 MED ORDER — COVID-19MRNA BIVAL VACC PFIZER 30 MCG/0.3ML IM SUSP
INTRAMUSCULAR | 0 refills | Status: DC
  Filled 2020-12-28: qty 0.3, 1d supply, fill #0

## 2021-04-27 DIAGNOSIS — H52203 Unspecified astigmatism, bilateral: Secondary | ICD-10-CM | POA: Diagnosis not present

## 2021-04-27 DIAGNOSIS — H353131 Nonexudative age-related macular degeneration, bilateral, early dry stage: Secondary | ICD-10-CM | POA: Diagnosis not present

## 2021-04-27 DIAGNOSIS — Z961 Presence of intraocular lens: Secondary | ICD-10-CM | POA: Diagnosis not present

## 2021-06-07 DIAGNOSIS — Z1231 Encounter for screening mammogram for malignant neoplasm of breast: Secondary | ICD-10-CM | POA: Diagnosis not present

## 2021-11-06 IMAGING — US US THYROID
1 series · 13 of 25 positions shown · non-contrast
Comparison: Prior thyroid ultrasound 08/11/2019

CLINICAL DATA: Goiter. Multinodular goiter. Patient has previously
undergone biopsy of 2 isthmus nodules completed in Monday August, 2010.

EXAM:
THYROID ULTRASOUND
TECHNIQUE: Ultrasound examination of the thyroid gland and adjacent soft
tissues was performed.

[Series 1: us thyroid · 0.08mm/px · 13 of 36 slices shown]
[im 1/36]
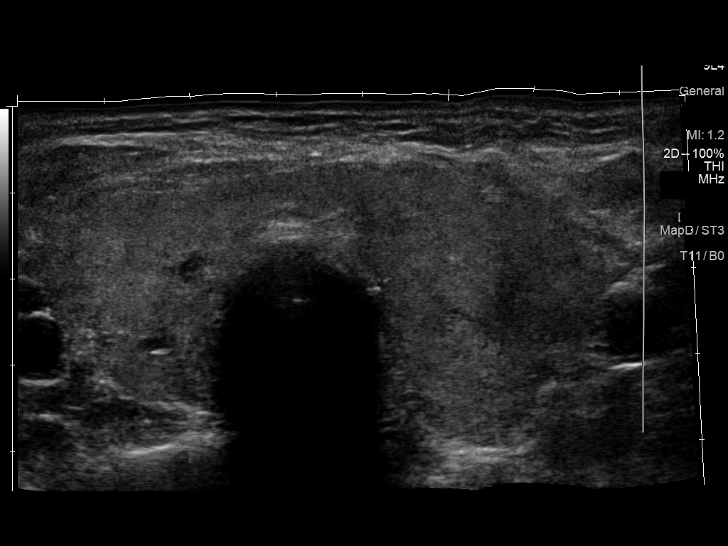
[im 3/36]
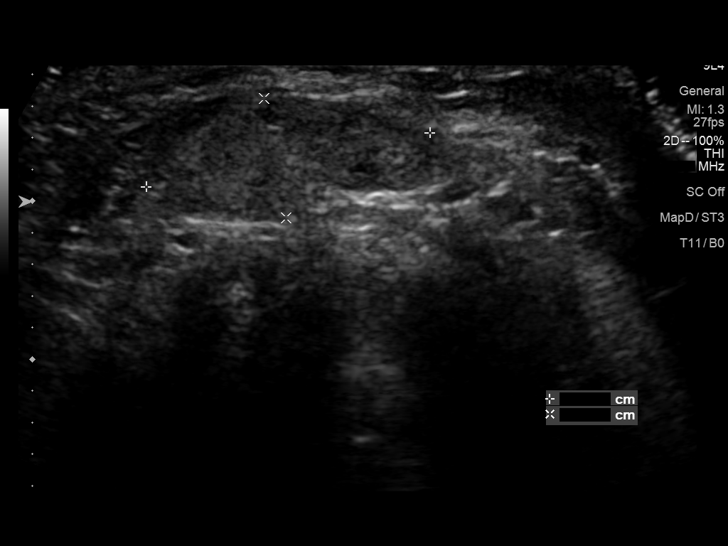
[im 6/36]
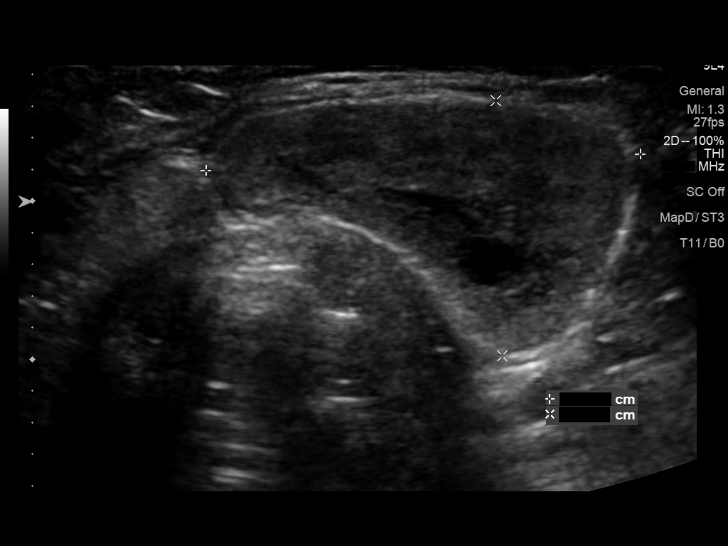
[im 9/36]
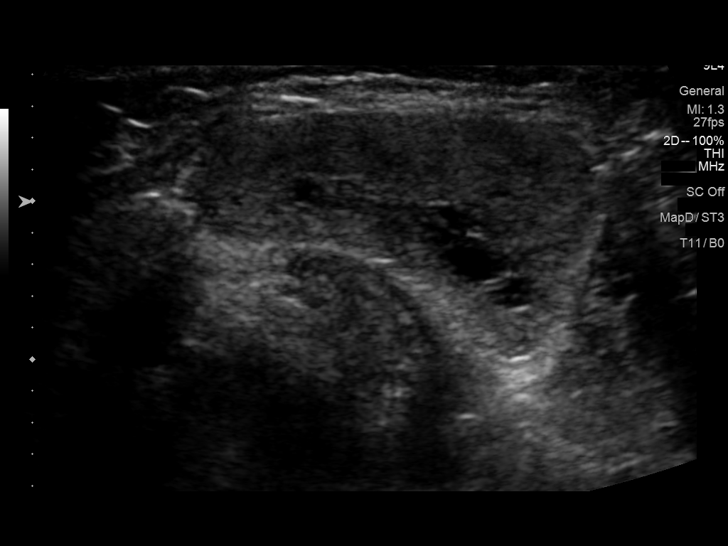
[im 12/36]
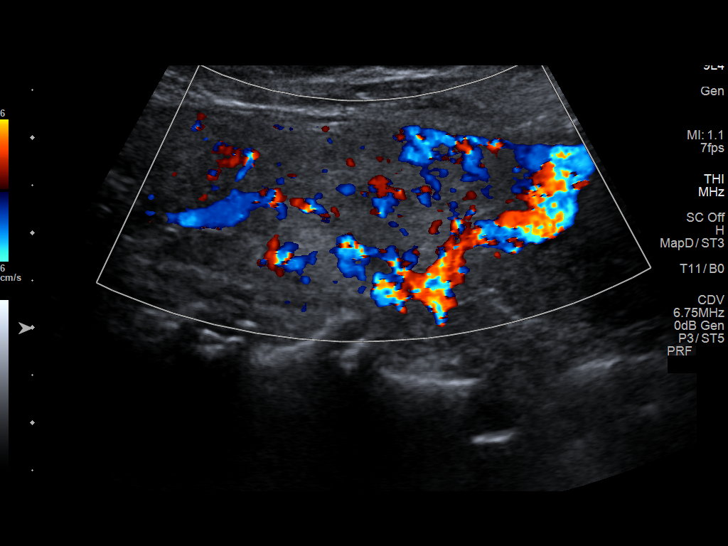
[im 15/36]
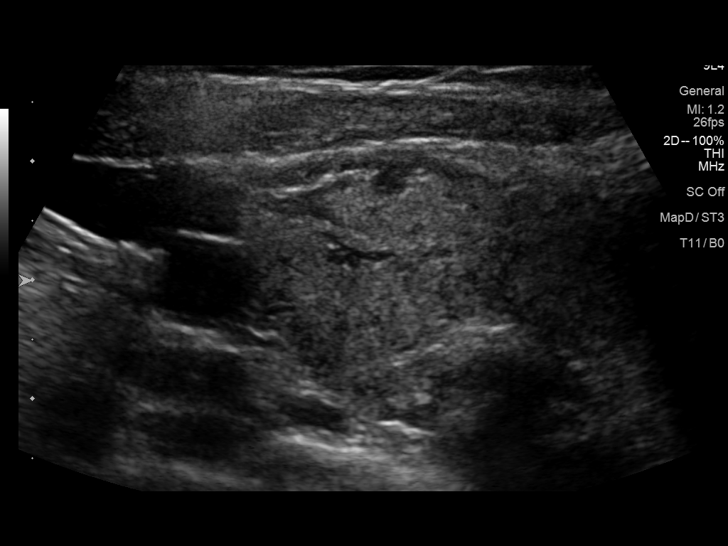
[im 18/36]
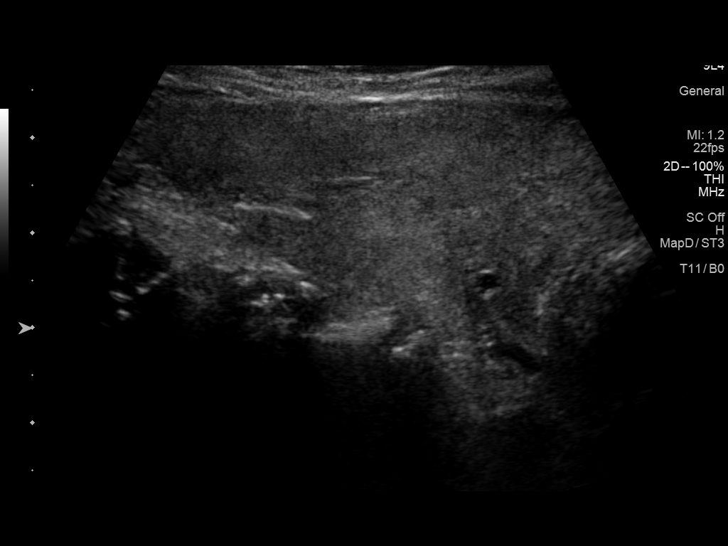
[im 21/36]
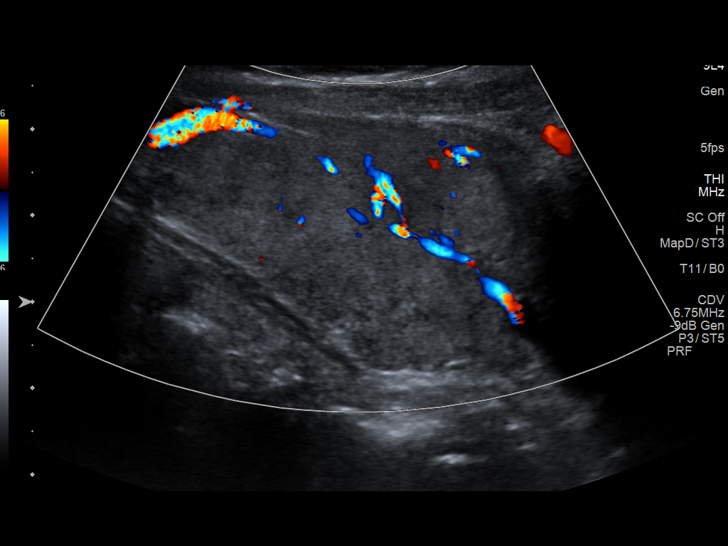
[im 24/36]
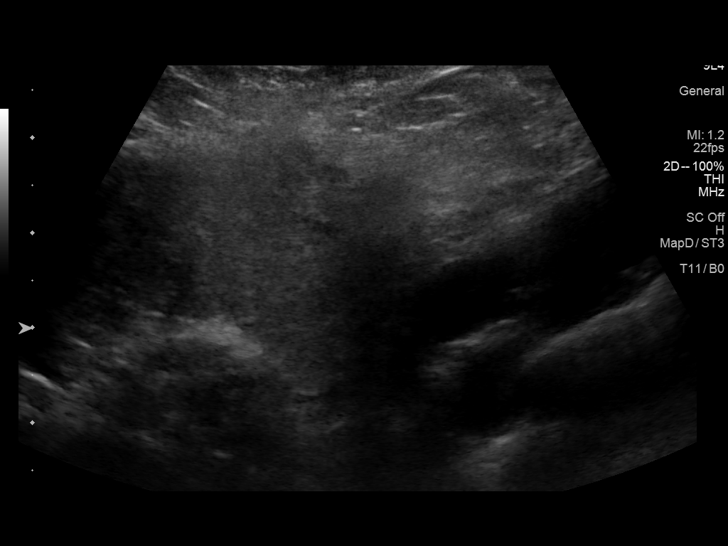
[im 27/36]
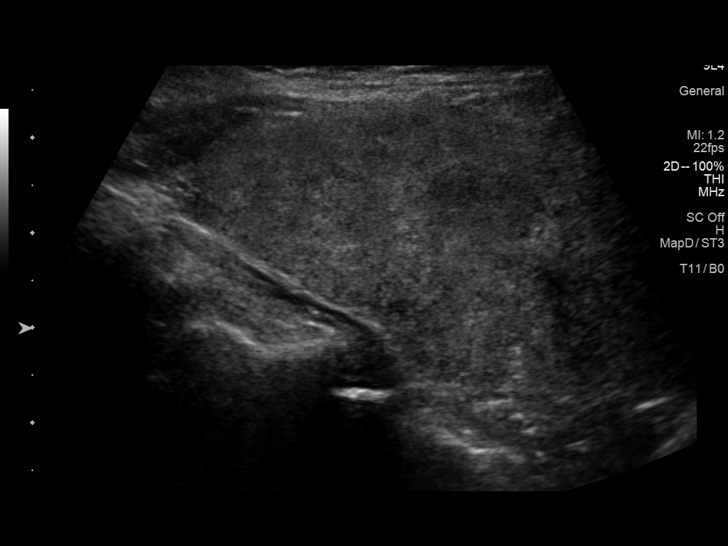
[im 30/36]
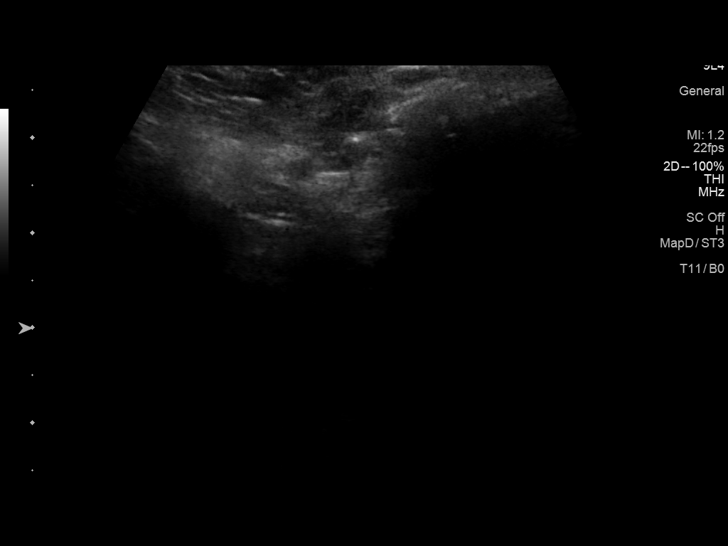
[im 33/36]
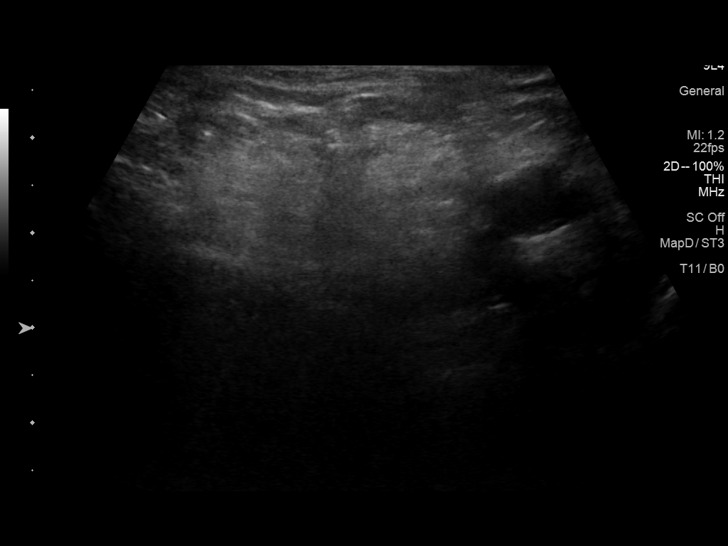
[im 36/36]
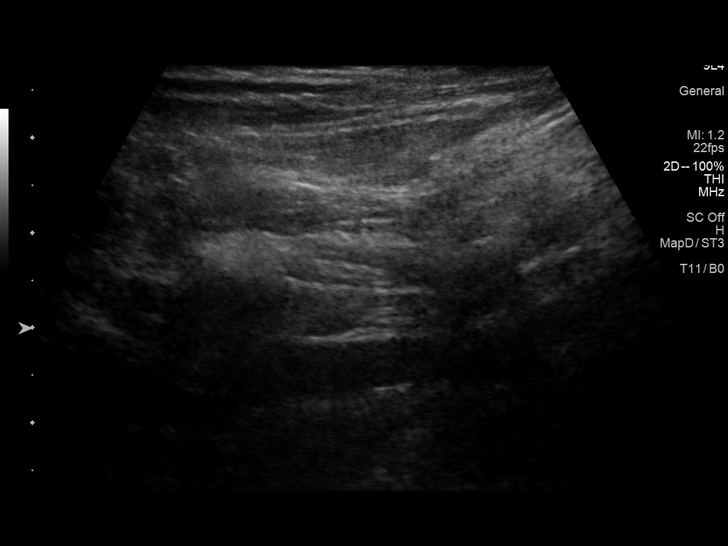

[13 of 25 positions shown; findings below may reference images not displayed]

FINDINGS: Parenchymal Echotexture: Moderately heterogenous

Isthmus: 0.8 cm

Right lobe: 7.6 x 3.1 x 2.4 cm

Left lobe: 5.8 x 3.3 x 2.9 cm

_________________________________________________________

Estimated total number of nodules >/= 1 cm: 2

Number of spongiform nodules >/=  2 cm not described below (TR1): 0

Number of mixed cystic and solid nodules >/= 1.5 cm not described
below (TR2): 0

_________________________________________________________

Nodule # 1: The previously biopsied nodule in the central aspect of
the thyroid isthmus remains essentially unchanged at 1.8 x 1.4 x
cm compared to 1.7 x 1.6 x 0.8 cm previously. The nodule remains
circumscribed, solid and isoechoic.

Nodule # 2: The previously biopsied nodule in the inferior and left
aspect of the thyroid isthmus also remains essentially unchanged at
3.4 x 2.6 x 1.6 cm. This nodule also remains solid and isoechoic
with internal areas of cystic degeneration. No significant
enlargement or new suspicious morphologic features.

The remaining thyroid gland remains heterogeneous and enlarged but
does not demonstrate additional focal nodularity.
IMPRESSION: No significant interval change in the size or appearance of the of
the previously biopsied nodule in the inferior and left aspect of
the thyroid isthmus compared to 08/11/2019 confirming 1 year of
stability. This nodule had previously enlarged compared to more
remote prior imaging from 0628.

Stable and benign previously biopsied nodule in the mid aspect of
the thyroid isthmus.

The above is in keeping with the ACR TI-RADS recommendations - [HOSPITAL] 7917;[DATE].

## 2021-12-20 DIAGNOSIS — D225 Melanocytic nevi of trunk: Secondary | ICD-10-CM | POA: Diagnosis not present

## 2021-12-20 DIAGNOSIS — D2261 Melanocytic nevi of right upper limb, including shoulder: Secondary | ICD-10-CM | POA: Diagnosis not present

## 2021-12-20 DIAGNOSIS — L821 Other seborrheic keratosis: Secondary | ICD-10-CM | POA: Diagnosis not present

## 2022-01-11 DIAGNOSIS — H903 Sensorineural hearing loss, bilateral: Secondary | ICD-10-CM | POA: Diagnosis not present

## 2022-01-11 DIAGNOSIS — H9313 Tinnitus, bilateral: Secondary | ICD-10-CM | POA: Diagnosis not present

## 2022-01-11 DIAGNOSIS — Z822 Family history of deafness and hearing loss: Secondary | ICD-10-CM | POA: Diagnosis not present

## 2022-01-25 ENCOUNTER — Other Ambulatory Visit (HOSPITAL_BASED_OUTPATIENT_CLINIC_OR_DEPARTMENT_OTHER): Payer: Self-pay

## 2022-01-25 MED ORDER — COMIRNATY 30 MCG/0.3ML IM SUSY
PREFILLED_SYRINGE | INTRAMUSCULAR | 0 refills | Status: DC
  Filled 2022-01-25: qty 0.3, 1d supply, fill #0

## 2022-01-30 DIAGNOSIS — E785 Hyperlipidemia, unspecified: Secondary | ICD-10-CM | POA: Diagnosis not present

## 2022-01-30 DIAGNOSIS — J309 Allergic rhinitis, unspecified: Secondary | ICD-10-CM | POA: Diagnosis not present

## 2022-01-30 DIAGNOSIS — I493 Ventricular premature depolarization: Secondary | ICD-10-CM | POA: Diagnosis not present

## 2022-01-30 DIAGNOSIS — Z0000 Encounter for general adult medical examination without abnormal findings: Secondary | ICD-10-CM | POA: Diagnosis not present

## 2022-01-30 DIAGNOSIS — M8588 Other specified disorders of bone density and structure, other site: Secondary | ICD-10-CM | POA: Diagnosis not present

## 2022-01-30 DIAGNOSIS — J4520 Mild intermittent asthma, uncomplicated: Secondary | ICD-10-CM | POA: Diagnosis not present

## 2022-01-30 DIAGNOSIS — F320 Major depressive disorder, single episode, mild: Secondary | ICD-10-CM | POA: Diagnosis not present

## 2022-01-30 DIAGNOSIS — E049 Nontoxic goiter, unspecified: Secondary | ICD-10-CM | POA: Diagnosis not present

## 2022-01-30 DIAGNOSIS — E559 Vitamin D deficiency, unspecified: Secondary | ICD-10-CM | POA: Diagnosis not present

## 2022-03-02 DIAGNOSIS — F4323 Adjustment disorder with mixed anxiety and depressed mood: Secondary | ICD-10-CM | POA: Diagnosis not present

## 2022-03-20 DIAGNOSIS — F4323 Adjustment disorder with mixed anxiety and depressed mood: Secondary | ICD-10-CM | POA: Diagnosis not present

## 2022-03-20 DIAGNOSIS — F320 Major depressive disorder, single episode, mild: Secondary | ICD-10-CM | POA: Diagnosis not present

## 2022-03-20 DIAGNOSIS — Z23 Encounter for immunization: Secondary | ICD-10-CM | POA: Diagnosis not present

## 2022-04-02 DIAGNOSIS — F4323 Adjustment disorder with mixed anxiety and depressed mood: Secondary | ICD-10-CM | POA: Diagnosis not present

## 2022-05-03 DIAGNOSIS — F4323 Adjustment disorder with mixed anxiety and depressed mood: Secondary | ICD-10-CM | POA: Diagnosis not present

## 2022-05-03 DIAGNOSIS — H52203 Unspecified astigmatism, bilateral: Secondary | ICD-10-CM | POA: Diagnosis not present

## 2022-05-03 DIAGNOSIS — D23121 Other benign neoplasm of skin of left upper eyelid, including canthus: Secondary | ICD-10-CM | POA: Diagnosis not present

## 2022-05-03 DIAGNOSIS — H353132 Nonexudative age-related macular degeneration, bilateral, intermediate dry stage: Secondary | ICD-10-CM | POA: Diagnosis not present

## 2022-05-03 DIAGNOSIS — D23122 Other benign neoplasm of skin of left lower eyelid, including canthus: Secondary | ICD-10-CM | POA: Diagnosis not present

## 2022-05-15 DIAGNOSIS — D23122 Other benign neoplasm of skin of left lower eyelid, including canthus: Secondary | ICD-10-CM | POA: Diagnosis not present

## 2022-05-15 DIAGNOSIS — D23121 Other benign neoplasm of skin of left upper eyelid, including canthus: Secondary | ICD-10-CM | POA: Diagnosis not present

## 2022-06-01 DIAGNOSIS — F4323 Adjustment disorder with mixed anxiety and depressed mood: Secondary | ICD-10-CM | POA: Diagnosis not present

## 2022-06-13 DIAGNOSIS — M810 Age-related osteoporosis without current pathological fracture: Secondary | ICD-10-CM | POA: Diagnosis not present

## 2022-06-13 DIAGNOSIS — Z1231 Encounter for screening mammogram for malignant neoplasm of breast: Secondary | ICD-10-CM | POA: Diagnosis not present

## 2022-06-13 DIAGNOSIS — M8589 Other specified disorders of bone density and structure, multiple sites: Secondary | ICD-10-CM | POA: Diagnosis not present

## 2022-06-19 DIAGNOSIS — F320 Major depressive disorder, single episode, mild: Secondary | ICD-10-CM | POA: Diagnosis not present

## 2022-06-19 DIAGNOSIS — F4323 Adjustment disorder with mixed anxiety and depressed mood: Secondary | ICD-10-CM | POA: Diagnosis not present

## 2022-06-19 DIAGNOSIS — M810 Age-related osteoporosis without current pathological fracture: Secondary | ICD-10-CM | POA: Diagnosis not present

## 2022-06-19 DIAGNOSIS — F39 Unspecified mood [affective] disorder: Secondary | ICD-10-CM | POA: Diagnosis not present

## 2022-08-15 DIAGNOSIS — M5451 Vertebrogenic low back pain: Secondary | ICD-10-CM | POA: Diagnosis not present

## 2023-02-15 DIAGNOSIS — E785 Hyperlipidemia, unspecified: Secondary | ICD-10-CM | POA: Diagnosis not present

## 2023-02-15 DIAGNOSIS — Z0000 Encounter for general adult medical examination without abnormal findings: Secondary | ICD-10-CM | POA: Diagnosis not present

## 2023-02-15 DIAGNOSIS — E049 Nontoxic goiter, unspecified: Secondary | ICD-10-CM | POA: Diagnosis not present

## 2023-02-15 DIAGNOSIS — Z23 Encounter for immunization: Secondary | ICD-10-CM | POA: Diagnosis not present

## 2023-02-15 DIAGNOSIS — E559 Vitamin D deficiency, unspecified: Secondary | ICD-10-CM | POA: Diagnosis not present

## 2023-02-15 DIAGNOSIS — J4520 Mild intermittent asthma, uncomplicated: Secondary | ICD-10-CM | POA: Diagnosis not present

## 2023-02-15 DIAGNOSIS — F339 Major depressive disorder, recurrent, unspecified: Secondary | ICD-10-CM | POA: Diagnosis not present

## 2023-02-15 DIAGNOSIS — F4323 Adjustment disorder with mixed anxiety and depressed mood: Secondary | ICD-10-CM | POA: Diagnosis not present

## 2023-02-15 DIAGNOSIS — J309 Allergic rhinitis, unspecified: Secondary | ICD-10-CM | POA: Diagnosis not present

## 2023-02-15 DIAGNOSIS — M810 Age-related osteoporosis without current pathological fracture: Secondary | ICD-10-CM | POA: Diagnosis not present

## 2023-05-15 DIAGNOSIS — H353122 Nonexudative age-related macular degeneration, left eye, intermediate dry stage: Secondary | ICD-10-CM | POA: Diagnosis not present

## 2023-05-15 DIAGNOSIS — H52203 Unspecified astigmatism, bilateral: Secondary | ICD-10-CM | POA: Diagnosis not present

## 2023-05-15 DIAGNOSIS — Z961 Presence of intraocular lens: Secondary | ICD-10-CM | POA: Diagnosis not present

## 2023-06-09 DIAGNOSIS — J209 Acute bronchitis, unspecified: Secondary | ICD-10-CM | POA: Diagnosis not present

## 2023-06-09 DIAGNOSIS — Z8709 Personal history of other diseases of the respiratory system: Secondary | ICD-10-CM | POA: Diagnosis not present

## 2023-06-09 DIAGNOSIS — R051 Acute cough: Secondary | ICD-10-CM | POA: Diagnosis not present

## 2023-06-09 DIAGNOSIS — R0981 Nasal congestion: Secondary | ICD-10-CM | POA: Diagnosis not present

## 2023-06-19 DIAGNOSIS — R040 Epistaxis: Secondary | ICD-10-CM | POA: Diagnosis not present

## 2023-06-19 DIAGNOSIS — I4892 Unspecified atrial flutter: Secondary | ICD-10-CM | POA: Diagnosis not present

## 2023-06-19 DIAGNOSIS — R052 Subacute cough: Secondary | ICD-10-CM | POA: Diagnosis not present

## 2023-06-19 DIAGNOSIS — R000 Tachycardia, unspecified: Secondary | ICD-10-CM | POA: Diagnosis not present

## 2023-06-19 DIAGNOSIS — R0609 Other forms of dyspnea: Secondary | ICD-10-CM | POA: Diagnosis not present

## 2023-06-19 DIAGNOSIS — R5383 Other fatigue: Secondary | ICD-10-CM | POA: Diagnosis not present

## 2023-06-20 ENCOUNTER — Ambulatory Visit (HOSPITAL_COMMUNITY)
Admission: RE | Admit: 2023-06-20 | Discharge: 2023-06-20 | Disposition: A | Discharge status: Home / Self Care | Source: Ambulatory Visit | Attending: Internal Medicine | Admitting: Internal Medicine

## 2023-06-20 VITALS — BP 90/60 | HR 122 | Ht 66.5 in | Wt 177.4 lb

## 2023-06-20 DIAGNOSIS — I4892 Unspecified atrial flutter: Secondary | ICD-10-CM | POA: Insufficient documentation

## 2023-06-20 DIAGNOSIS — Z7901 Long term (current) use of anticoagulants: Secondary | ICD-10-CM | POA: Diagnosis not present

## 2023-06-20 DIAGNOSIS — J45909 Unspecified asthma, uncomplicated: Secondary | ICD-10-CM | POA: Diagnosis not present

## 2023-06-20 DIAGNOSIS — I480 Paroxysmal atrial fibrillation: Secondary | ICD-10-CM | POA: Insufficient documentation

## 2023-06-20 DIAGNOSIS — D6869 Other thrombophilia: Secondary | ICD-10-CM | POA: Insufficient documentation

## 2023-06-20 DIAGNOSIS — I4891 Unspecified atrial fibrillation: Secondary | ICD-10-CM | POA: Diagnosis not present

## 2023-06-20 DIAGNOSIS — I4819 Other persistent atrial fibrillation: Secondary | ICD-10-CM | POA: Insufficient documentation

## 2023-06-20 NOTE — Patient Instructions (Signed)
 Please obtain a blood pressure arm cuff to measure blood pressure at home.  Do not begin diltiazem - your blood pressure currently is too low.  Please begin Eliquis 5 mg twice a day (12 hours apart).  Please call if you feel like you are going to faint or develop chest pain.    [employer] device

## 2023-06-20 NOTE — Progress Notes (Signed)
 Primary Care Physician: Laurann Montana, MD Primary Cardiologist: None Electrophysiologist: None     Referring Physician: Dr. Linard Millers is a 78 y.o. female with a history of asthma, HLD, osteopenia, PVCs, and atrial flutter who presents for consultation in the [facility]. Seen by PCP on 06/19/23 for recent URI (took prednisone) and found to be in new onset atrial flutter. Started on diltiazem 120 mg daily. Patient is on Eliquis 5 mg BID for a CHADS2VASC score of 3.  On evaluation today, she is currently in atrial flutter with RVR. She has not started diltiazem or Eliquis yet. She feels tired and a little SOB. Denies chest pain or presyncope. She is not sure if she snores.   Today, she denies symptoms of palpitations,  orthopnea, PND, lower extremity edema, dizziness, snoring, daytime somnolence, bleeding, or neurologic sequela. The patient is tolerating medications without difficulties and is otherwise without complaint today.    she has a BMI of Body mass index is 28.2 kg/m.Marland Kitchen Filed Weights   06/20/23 1411  Weight: 80.5 kg    Current Outpatient Medications  Medication Sig Dispense Refill   acetaminophen (TYLENOL) 500 MG tablet Take 1,000 mg by mouth as needed for moderate pain (pain score 4-6) or headache.     carboxymethylcellulose (REFRESH PLUS) 0.5 % SOLN Place 1 drop into both eyes as needed.     Cholecalciferol (HM VITAMIN D3) 2000 units CAPS Take 2,000 Units by mouth daily. Sometimes will take 4000 units during the week     escitalopram (LEXAPRO) 10 MG tablet Take 10 mg by mouth daily.     Multiple Vitamins-Minerals (MULTIVITAMIN PO) Take 1 tablet by mouth daily.     PROAIR HFA 108 (90 Base) MCG/ACT inhaler Inhale 1-2 puffs into the lungs every 4 (four) hours as needed for shortness of breath or wheezing.  1   vitamin B-12 (CYANOCOBALAMIN) 500 MCG tablet Take 500 mcg by mouth daily.     DILT-XR 120 MG 24 hr capsule Take 120 mg  by mouth daily. (Patient not taking: Reported on 06/20/2023)     ELIQUIS 5 MG TABS tablet Take 5 mg by mouth 2 (two) times daily. (Patient not taking: Reported on 06/20/2023)     No current facility-administered medications for this encounter.    Atrial Fibrillation Management history:  Previous antiarrhythmic drugs: none Previous cardioversions: none Previous ablations: none Anticoagulation history: Eliquis 5 mg BID   ROS- All systems are reviewed and negative except as per the HPI above.  Physical Exam: BP 90/60   Pulse (!) 122   Ht 5' 6.5" (1.689 m)   Wt 80.5 kg   BMI 28.20 kg/m   GEN: Well nourished, well developed in no acute distress NECK: No JVD; No carotid bruits CARDIAC: Irregularly irregular tachycardic rate and rhythm, no murmurs, rubs, gallops RESPIRATORY:  Clear to auscultation without rales, wheezing or rhonchi  ABDOMEN: Soft, non-tender, non-distended EXTREMITIES:  No edema; No deformity   EKG today demonstrates  Vent. rate 122 BPM PR interval 134 ms QRS duration 68 ms QT/QTcB 308/438 ms P-R-T axes 79 31 58 Sinus tachycardia - appears to be atrial flutter with RVR Low voltage QRS Cannot rule out Anterior infarct , age undetermined Abnormal ECG No previous ECGs available  Echo N/A  ASSESSMENT & PLAN CHA2DS2-VASc Score = 3  The patient's score is based upon: CHF History: 0 HTN History: 0 Diabetes History: 0 Stroke History: 0 Vascular Disease History:  0 Age Score: 2 Gender Score: 1       ASSESSMENT AND PLAN: Paroxysmal Atrial Flutter The patient's CHA2DS2-VASc score is 3, indicating a 3.2% annual risk of stroke.    She is currently in atrial flutter with RVR. Education provided about Afib. We also discussed triggers for Afib. Her blood pressure is too low to begin diltiazem so we will defer this for now. We discussed cardioversion as a procedure to try to convert her to NSR. She is very interested in pursuing this procedure. Discussion about  medication treatments and ablation going forward if indicated for ERAF post procedure. Rhythm monitoring device recommended. Follow up 2 weeks to reassess. I advised patient to call clinic if she notes new presyncope or chest pain.   Secondary Hypercoagulable State (ICD10:  D68.69) The patient is at significant risk for stroke/thromboembolism based upon her CHA2DS2-VASc Score of 3.    She has not started Eliquis yet. We discussed risks vs benefits of anticoagulation in terms of stroke prevention related to atrial flutter and bleeding risk. After discussion, patient is going to start Eliquis 5 mg BID later today.     Follow up 2 weeks to reassess.    Lake Bells, PA-C  Afib Clinic [facility] [address] [place], [place] [address] [phone]

## 2023-07-04 DIAGNOSIS — Z1231 Encounter for screening mammogram for malignant neoplasm of breast: Secondary | ICD-10-CM | POA: Diagnosis not present

## 2023-07-09 ENCOUNTER — Ambulatory Visit (HOSPITAL_COMMUNITY)
Admission: RE | Admit: 2023-07-09 | Discharge: 2023-07-09 | Disposition: A | Discharge status: Home / Self Care | Source: Ambulatory Visit | Attending: Internal Medicine | Admitting: Internal Medicine

## 2023-07-09 VITALS — BP 132/102 | HR 131 | Ht 66.5 in | Wt 179.4 lb

## 2023-07-09 DIAGNOSIS — D6869 Other thrombophilia: Secondary | ICD-10-CM | POA: Diagnosis not present

## 2023-07-09 DIAGNOSIS — I4819 Other persistent atrial fibrillation: Secondary | ICD-10-CM

## 2023-07-09 DIAGNOSIS — I4892 Unspecified atrial flutter: Secondary | ICD-10-CM | POA: Diagnosis not present

## 2023-07-09 DIAGNOSIS — M8580 Other specified disorders of bone density and structure, unspecified site: Secondary | ICD-10-CM | POA: Diagnosis not present

## 2023-07-09 DIAGNOSIS — Z7901 Long term (current) use of anticoagulants: Secondary | ICD-10-CM | POA: Diagnosis not present

## 2023-07-09 DIAGNOSIS — J45909 Unspecified asthma, uncomplicated: Secondary | ICD-10-CM | POA: Diagnosis not present

## 2023-07-09 DIAGNOSIS — I493 Ventricular premature depolarization: Secondary | ICD-10-CM | POA: Diagnosis not present

## 2023-07-09 DIAGNOSIS — E785 Hyperlipidemia, unspecified: Secondary | ICD-10-CM | POA: Insufficient documentation

## 2023-07-09 DIAGNOSIS — I4891 Unspecified atrial fibrillation: Secondary | ICD-10-CM | POA: Diagnosis not present

## 2023-07-09 LAB — CBC
HCT: 36.7 % (ref 36.0–46.0)
Hemoglobin: 11.6 g/dL — ABNORMAL LOW (ref 12.0–15.0)
MCH: 27 pg (ref 26.0–34.0)
MCHC: 31.6 g/dL (ref 30.0–36.0)
MCV: 85.3 fL (ref 80.0–100.0)
Platelets: 305 10*3/uL (ref 150–400)
RBC: 4.3 MIL/uL (ref 3.87–5.11)
RDW: 14.1 % (ref 11.5–15.5)
WBC: 3.9 10*3/uL — ABNORMAL LOW (ref 4.0–10.5)
nRBC: 0 % (ref 0.0–0.2)

## 2023-07-09 LAB — BASIC METABOLIC PANEL WITH GFR
Anion gap: 8 (ref 5–15)
BUN: 17 mg/dL (ref 8–23)
CO2: 26 mmol/L (ref 22–32)
Calcium: 9.4 mg/dL (ref 8.9–10.3)
Chloride: 105 mmol/L (ref 98–111)
Creatinine, Ser: 0.9 mg/dL (ref 0.44–1.00)
GFR, Estimated: >60 mL/min (ref >60)
Glucose, Bld: 89 mg/dL (ref 70–99)
Potassium: 4.4 mmol/L (ref 3.5–5.1)
Sodium: 139 mmol/L (ref 135–145)

## 2023-07-09 NOTE — Patient Instructions (Signed)
 Cardioversion scheduled for: Monday, April 14th   - Arrive at the [facility] and go to admitting at 930am   - Do not eat or drink anything after midnight the night prior to your procedure.   - Take all your morning medication (except diabetic medications) with a sip of water prior to arrival.  - You will not be able to drive home after your procedure.    - Do NOT miss any doses of your blood thinner - if you should miss a dose please notify our office immediately.   - If you feel as if you go back into normal rhythm prior to scheduled cardioversion, please notify our office immediately.   If your procedure is canceled in the cardioversion suite you will be charged a cancellation fee.

## 2023-07-09 NOTE — H&P (View-Only) (Signed)
 Primary Care Physician: Laurann Montana, MD Primary Cardiologist: None Electrophysiologist: None     Referring Physician: Dr. Linard Millers is a 78 y.o. female with a history of asthma, HLD, osteopenia, PVCs, and atrial flutter who presents for consultation in the [facility]. Seen by PCP on 06/19/23 for recent URI (took prednisone) and found to be in new onset atrial flutter. Started on diltiazem 120 mg daily. Patient is on Eliquis 5 mg BID for a CHADS2VASC score of 3.  On follow up 07/09/23, she is currently in atrial flutter with RVR. We could not begin rate control at last visit due to low blood pressure. She notes BP at home runs typically right around 100 systolic but several readings are in the 90s. She began Eliquis 5 mg BID at last visit after discussion; no missed doses. She took two doses of Eliquis on 3/20.   Today, she denies symptoms of palpitations,  orthopnea, PND, lower extremity edema, dizziness, snoring, daytime somnolence, bleeding, or neurologic sequela. The patient is tolerating medications without difficulties and is otherwise without complaint today.    she has a BMI of Body mass index is 28.52 kg/m.Marland Kitchen Filed Weights   07/09/23 0820  Weight: 81.4 kg    Current Outpatient Medications  Medication Sig Dispense Refill   acetaminophen (TYLENOL) 500 MG tablet Take 1,000 mg by mouth as needed for moderate pain (pain score 4-6) or headache.     carboxymethylcellulose (REFRESH PLUS) 0.5 % SOLN Place 1 drop into both eyes as needed.     Cholecalciferol (HM VITAMIN D3) 2000 units CAPS Take 2,000 Units by mouth daily. Sometimes will take 4000 units during the week     DILT-XR 120 MG 24 hr capsule Take 120 mg by mouth daily. (Patient not taking: Reported on 06/20/2023)     ELIQUIS 5 MG TABS tablet Take 5 mg by mouth 2 (two) times daily. (Patient not taking: Reported on 06/20/2023)     escitalopram (LEXAPRO) 10 MG tablet Take 10 mg by  mouth daily.     Multiple Vitamins-Minerals (MULTIVITAMIN PO) Take 1 tablet by mouth daily.     PROAIR HFA 108 (90 Base) MCG/ACT inhaler Inhale 1-2 puffs into the lungs every 4 (four) hours as needed for shortness of breath or wheezing.  1   vitamin B-12 (CYANOCOBALAMIN) 500 MCG tablet Take 500 mcg by mouth daily.     No current facility-administered medications for this encounter.    Atrial Fibrillation Management history:  Previous antiarrhythmic drugs: none Previous cardioversions: none Previous ablations: none Anticoagulation history: Eliquis 5 mg BID   ROS- All systems are reviewed and negative except as per the HPI above.  Physical Exam: Ht 5' 6.5" (1.689 m)   Wt 81.4 kg   BMI 28.52 kg/m   GEN- The patient is well appearing, alert and oriented x 3 today.   Neck - no JVD or carotid bruit noted Lungs- Clear to ausculation bilaterally, normal work of breathing Heart- Irregular tachycardic rate, no murmurs, rubs or gallops, PMI not laterally displaced Extremities- no clubbing, cyanosis, or edema Skin - no rash or ecchymosis noted   EKG today demonstrates  Vent. rate 131 BPM PR interval * ms QRS duration 68 ms QT/QTcB 294/434 ms P-R-T axes 71 40 42 Atrial flutter with 2:1 A-V conduction Cannot rule out Anterior infarct , age undetermined Abnormal ECG When compared with ECG of 20-Jun-2023 14:39, PREVIOUS ECG IS PRESENT  Echo N/A  ASSESSMENT & PLAN CHA2DS2-VASc Score = 3  The patient's score is based upon: CHF History: 0 HTN History: 0 Diabetes History: 0 Stroke History: 0 Vascular Disease History: 0 Age Score: 2 Gender Score: 1       ASSESSMENT AND PLAN: Persistent Atrial Flutter The patient's CHA2DS2-VASc score is 3, indicating a 3.2% annual risk of stroke.    She is currently in atrial flutter with RVR. We discussed the procedure cardioversion to try to convert to NSR. We discussed the risks vs benefits of this procedure and how ultimately we cannot  predict whether a patient will have early return of arrhythmia post procedure. After discussion, the patient wishes to proceed with cardioversion. Labs drawn today.   Informed Consent   Shared Decision Making/Informed Consent The risks (stroke, cardiac arrhythmias rarely resulting in the need for a temporary or permanent pacemaker, skin irritation or burns and complications associated with conscious sedation including aspiration, arrhythmia, respiratory failure and death), benefits (restoration of normal sinus rhythm) and alternatives of a direct current cardioversion were explained in detail to Ms. Echavarria and she agrees to proceed.       Secondary Hypercoagulable State (ICD10:  D68.69) The patient is at significant risk for stroke/thromboembolism based upon her CHA2DS2-VASc Score of 3.   Continue Eliquis 5 mg BID without interruption.    Follow up 2 weeks after DCCV.   Lake Bells, PA-C  Afib Clinic [facility] [address] [place], [place] [address] [phone]

## 2023-07-09 NOTE — Progress Notes (Signed)
 Primary Care Physician: Laurann Montana, MD Primary Cardiologist: None Electrophysiologist: None     Referring Physician: Dr. Linard Millers is a 78 y.o. female with a history of asthma, HLD, osteopenia, PVCs, and atrial flutter who presents for consultation in the [facility]. Seen by PCP on 06/19/23 for recent URI (took prednisone) and found to be in new onset atrial flutter. Started on diltiazem 120 mg daily. Patient is on Eliquis 5 mg BID for a CHADS2VASC score of 3.  On follow up 07/09/23, she is currently in atrial flutter with RVR. We could not begin rate control at last visit due to low blood pressure. She notes BP at home runs typically right around 100 systolic but several readings are in the 90s. She began Eliquis 5 mg BID at last visit after discussion; no missed doses. She took two doses of Eliquis on 3/20.   Today, she denies symptoms of palpitations,  orthopnea, PND, lower extremity edema, dizziness, snoring, daytime somnolence, bleeding, or neurologic sequela. The patient is tolerating medications without difficulties and is otherwise without complaint today.    she has a BMI of Body mass index is 28.52 kg/m.Marland Kitchen Filed Weights   07/09/23 0820  Weight: 81.4 kg    Current Outpatient Medications  Medication Sig Dispense Refill   acetaminophen (TYLENOL) 500 MG tablet Take 1,000 mg by mouth as needed for moderate pain (pain score 4-6) or headache.     carboxymethylcellulose (REFRESH PLUS) 0.5 % SOLN Place 1 drop into both eyes as needed.     Cholecalciferol (HM VITAMIN D3) 2000 units CAPS Take 2,000 Units by mouth daily. Sometimes will take 4000 units during the week     DILT-XR 120 MG 24 hr capsule Take 120 mg by mouth daily. (Patient not taking: Reported on 06/20/2023)     ELIQUIS 5 MG TABS tablet Take 5 mg by mouth 2 (two) times daily. (Patient not taking: Reported on 06/20/2023)     escitalopram (LEXAPRO) 10 MG tablet Take 10 mg by  mouth daily.     Multiple Vitamins-Minerals (MULTIVITAMIN PO) Take 1 tablet by mouth daily.     PROAIR HFA 108 (90 Base) MCG/ACT inhaler Inhale 1-2 puffs into the lungs every 4 (four) hours as needed for shortness of breath or wheezing.  1   vitamin B-12 (CYANOCOBALAMIN) 500 MCG tablet Take 500 mcg by mouth daily.     No current facility-administered medications for this encounter.    Atrial Fibrillation Management history:  Previous antiarrhythmic drugs: none Previous cardioversions: none Previous ablations: none Anticoagulation history: Eliquis 5 mg BID   ROS- All systems are reviewed and negative except as per the HPI above.  Physical Exam: Ht 5' 6.5" (1.689 m)   Wt 81.4 kg   BMI 28.52 kg/m   GEN- The patient is well appearing, alert and oriented x 3 today.   Neck - no JVD or carotid bruit noted Lungs- Clear to ausculation bilaterally, normal work of breathing Heart- Irregular tachycardic rate, no murmurs, rubs or gallops, PMI not laterally displaced Extremities- no clubbing, cyanosis, or edema Skin - no rash or ecchymosis noted   EKG today demonstrates  Vent. rate 131 BPM PR interval * ms QRS duration 68 ms QT/QTcB 294/434 ms P-R-T axes 71 40 42 Atrial flutter with 2:1 A-V conduction Cannot rule out Anterior infarct , age undetermined Abnormal ECG When compared with ECG of 20-Jun-2023 14:39, PREVIOUS ECG IS PRESENT  Echo N/A  ASSESSMENT & PLAN CHA2DS2-VASc Score = 3  The patient's score is based upon: CHF History: 0 HTN History: 0 Diabetes History: 0 Stroke History: 0 Vascular Disease History: 0 Age Score: 2 Gender Score: 1       ASSESSMENT AND PLAN: Persistent Atrial Flutter The patient's CHA2DS2-VASc score is 3, indicating a 3.2% annual risk of stroke.    She is currently in atrial flutter with RVR. We discussed the procedure cardioversion to try to convert to NSR. We discussed the risks vs benefits of this procedure and how ultimately we cannot  predict whether a patient will have early return of arrhythmia post procedure. After discussion, the patient wishes to proceed with cardioversion. Labs drawn today.   Informed Consent   Shared Decision Making/Informed Consent The risks (stroke, cardiac arrhythmias rarely resulting in the need for a temporary or permanent pacemaker, skin irritation or burns and complications associated with conscious sedation including aspiration, arrhythmia, respiratory failure and death), benefits (restoration of normal sinus rhythm) and alternatives of a direct current cardioversion were explained in detail to Ms. Echavarria and she agrees to proceed.       Secondary Hypercoagulable State (ICD10:  D68.69) The patient is at significant risk for stroke/thromboembolism based upon her CHA2DS2-VASc Score of 3.   Continue Eliquis 5 mg BID without interruption.    Follow up 2 weeks after DCCV.   Lake Bells, PA-C  Afib Clinic [facility] [address] [place], [place] [address] [phone]

## 2023-07-11 ENCOUNTER — Other Ambulatory Visit (HOSPITAL_COMMUNITY): Payer: Self-pay

## 2023-07-11 DIAGNOSIS — I4891 Unspecified atrial fibrillation: Secondary | ICD-10-CM

## 2023-07-13 NOTE — Progress Notes (Signed)
 Spoke to patient and instructed them to come at 0830  and to be NPO after 0000.  Medications reviewed.    Confirmed that patient will have a ride home and someone to stay with them for 24 hours after the procedure.

## 2023-07-16 ENCOUNTER — Other Ambulatory Visit: Payer: Self-pay

## 2023-07-16 ENCOUNTER — Ambulatory Visit (HOSPITAL_COMMUNITY): Admitting: Anesthesiology

## 2023-07-16 ENCOUNTER — Encounter (HOSPITAL_COMMUNITY)
Admission: RE | Disposition: A | Discharge status: Home / Self Care | Payer: Self-pay | Source: Home / Self Care | Attending: Cardiology

## 2023-07-16 ENCOUNTER — Encounter (HOSPITAL_COMMUNITY): Payer: Self-pay | Admitting: Cardiology

## 2023-07-16 ENCOUNTER — Ambulatory Visit (HOSPITAL_COMMUNITY)
Admission: RE | Admit: 2023-07-16 | Discharge: 2023-07-16 | Disposition: A | Discharge status: Home / Self Care | Attending: Cardiology | Admitting: Cardiology

## 2023-07-16 DIAGNOSIS — M8580 Other specified disorders of bone density and structure, unspecified site: Secondary | ICD-10-CM | POA: Insufficient documentation

## 2023-07-16 DIAGNOSIS — I4892 Unspecified atrial flutter: Secondary | ICD-10-CM

## 2023-07-16 DIAGNOSIS — E785 Hyperlipidemia, unspecified: Secondary | ICD-10-CM | POA: Insufficient documentation

## 2023-07-16 DIAGNOSIS — D6869 Other thrombophilia: Secondary | ICD-10-CM | POA: Insufficient documentation

## 2023-07-16 DIAGNOSIS — I4819 Other persistent atrial fibrillation: Secondary | ICD-10-CM

## 2023-07-16 DIAGNOSIS — J45909 Unspecified asthma, uncomplicated: Secondary | ICD-10-CM | POA: Insufficient documentation

## 2023-07-16 DIAGNOSIS — I4891 Unspecified atrial fibrillation: Secondary | ICD-10-CM

## 2023-07-16 DIAGNOSIS — Z7901 Long term (current) use of anticoagulants: Secondary | ICD-10-CM | POA: Diagnosis not present

## 2023-07-16 HISTORY — PX: CARDIOVERSION: EP1203

## 2023-07-16 SURGERY — CARDIOVERSION (CATH LAB)
Anesthesia: Monitor Anesthesia Care

## 2023-07-16 MED ORDER — PROPOFOL 10 MG/ML IV BOLUS
INTRAVENOUS | Status: DC | PRN
  Administered 2023-07-16: 50 mg via INTRAVENOUS

## 2023-07-16 MED ORDER — SODIUM CHLORIDE 0.9% FLUSH
3.0000 mL | Freq: Two times a day (BID) | INTRAVENOUS | Status: DC
Start: 2023-07-16 — End: 2023-07-16

## 2023-07-16 MED ORDER — PHENYLEPHRINE HCL (PRESSORS) 10 MG/ML IV SOLN
INTRAVENOUS | Status: DC | PRN
  Administered 2023-07-16: 80 ug via INTRAVENOUS

## 2023-07-16 MED ORDER — LIDOCAINE 2% (20 MG/ML) 5 ML SYRINGE
INTRAMUSCULAR | Status: DC | PRN
  Administered 2023-07-16: 50 mg via INTRAVENOUS

## 2023-07-16 MED ORDER — SODIUM CHLORIDE 0.9% FLUSH
3.0000 mL | INTRAVENOUS | Status: DC | PRN
  Administered 2023-07-16: 10 mL via INTRAVENOUS

## 2023-07-16 SURGICAL SUPPLY — 1 items: PAD DEFIB RADIO PHYSIO CONN (PAD) ×1 IMPLANT

## 2023-07-16 NOTE — Interval H&P Note (Signed)
 History and Physical Interval Note:  07/16/2023 9:39 AM  Heather Fitzpatrick  has presented today for surgery, with the diagnosis of AFIB.  The various methods of treatment have been discussed with the patient and family. After consideration of risks, benefits and other options for treatment, the patient has consented to  Procedure(s): CARDIOVERSION (N/A) as a surgical intervention.  The patient's history has been reviewed, patient examined, no change in status, stable for surgery.  I have reviewed the patient's chart and labs.  Questions were answered to the patient's satisfaction.     [employer]

## 2023-07-16 NOTE — Transfer of Care (Signed)
 Immediate Anesthesia Transfer of Care Note  Patient: Heather Fitzpatrick  Procedure(s) Performed: CARDIOVERSION  Patient Location: Cath Lab  Anesthesia Type:General  Level of Consciousness: awake, drowsy, and patient cooperative  Airway & Oxygen Therapy: Patient Spontanous Breathing and Patient connected to nasal cannula oxygen  Post-op Assessment: Report given to RN and Post -op Vital signs reviewed and stable  Post vital signs: Reviewed and stable  Last Vitals:  Vitals Value Taken Time  BP 96/74 07/16/23 0952  Temp    Pulse 78 07/16/23 0952  Resp 20 07/16/23 0952  SpO2 100 % 07/16/23 0952  Vitals shown include unfiled device data.  Last Pain:  Vitals:   07/16/23 0911  TempSrc:   PainSc: 0-No pain         Complications: No notable events documented.

## 2023-07-16 NOTE — Anesthesia Preprocedure Evaluation (Signed)
 Anesthesia Evaluation  Patient identified by MRN, date of birth, ID band Patient awake    Reviewed: Allergy & Precautions, NPO status , Patient's Chart, lab work & pertinent test results  Airway Mallampati: II  TM Distance: >3 FB     Dental no notable dental hx. (+) Dental Advisory Given, Caps   Pulmonary asthma    Pulmonary exam normal breath sounds clear to auscultation       Cardiovascular Normal cardiovascular exam+ dysrhythmias Atrial Fibrillation  Rhythm:Irregular Rate:Normal     Neuro/Psych  PSYCHIATRIC DISORDERS  Depression    negative neurological ROS     GI/Hepatic negative GI ROS, Neg liver ROS,,,  Endo/Other  negative endocrine ROS    Renal/GU negative Renal ROS  negative genitourinary   Musculoskeletal  (+) Arthritis , Osteoarthritis,    Abdominal   Peds  Hematology Eliquis therapy- last dose this am   Anesthesia Other Findings   Reproductive/Obstetrics                              Anesthesia Physical Anesthesia Plan  ASA: 2  Anesthesia Plan:    Post-op Pain Management: Minimal or no pain anticipated   Induction: Intravenous  PONV Risk Score and Plan: 2 and Propofol infusion and Treatment may vary due to age or medical condition  Airway Management Planned: Natural Airway and Mask  Additional Equipment: None  Intra-op Plan:   Post-operative Plan:   Informed Consent: I have reviewed the patients History and Physical, chart, labs and discussed the procedure including the risks, benefits and alternatives for the proposed anesthesia with the patient or authorized representative who has indicated his/her understanding and acceptance.     Dental advisory given  Plan Discussed with: CRNA and Anesthesiologist  Anesthesia Plan Comments:          Anesthesia Quick Evaluation

## 2023-07-16 NOTE — CV Procedure (Signed)
    Electrical Cardioversion Procedure Note Heather Fitzpatrick [id number] 03/29/1946  Procedure: Electrical Cardioversion Indications:  Atrial Flutter  Time Out: Verified patient identification, verified procedure,medications/allergies/relevent history reviewed, required imaging and test results available.  Performed  Procedure Details  The patient was NPO after midnight. Anesthesia was administered at the beside  by Dr.Foster with 50mg  of propofol.  Cardioversion was performed with synchronized biphasic defibrillation via AP pads with 100 joules.  1 attempt(s) were performed.  The patient converted to normal sinus rhythm. The patient tolerated the procedure well   IMPRESSION:  Successful cardioversion of atrial flutter.    Dorothye Gathers 07/16/2023, 9:51 AM

## 2023-07-16 NOTE — Anesthesia Postprocedure Evaluation (Signed)
 Anesthesia Post Note  Patient: Heather Fitzpatrick  Procedure(s) Performed: CARDIOVERSION     Patient location during evaluation: PACU Anesthesia Type: General Level of consciousness: awake and alert and oriented Pain management: pain level controlled Vital Signs Assessment: post-procedure vital signs reviewed and stable Respiratory status: spontaneous breathing, nonlabored ventilation and respiratory function stable Cardiovascular status: blood pressure returned to baseline and stable Postop Assessment: no apparent nausea or vomiting Anesthetic complications: no   No notable events documented.  Last Vitals:  Vitals:   07/16/23 0954 07/16/23 0956  BP: 96/74 122/81  Pulse:  81  Resp:  15  Temp:    SpO2:  99%    Last Pain:  Vitals:   07/16/23 0911  TempSrc:   PainSc: 0-No pain                 Anton Cheramie A.

## 2023-07-31 ENCOUNTER — Ambulatory Visit (HOSPITAL_COMMUNITY)
Admission: RE | Admit: 2023-07-31 | Discharge: 2023-07-31 | Disposition: A | Discharge status: Home / Self Care | Source: Ambulatory Visit | Attending: Internal Medicine | Admitting: Internal Medicine

## 2023-07-31 DIAGNOSIS — I4892 Unspecified atrial flutter: Secondary | ICD-10-CM | POA: Insufficient documentation

## 2023-07-31 DIAGNOSIS — I4891 Unspecified atrial fibrillation: Secondary | ICD-10-CM | POA: Insufficient documentation

## 2023-07-31 LAB — ECHOCARDIOGRAM COMPLETE
AR max vel: 3.41 cm2
AV Area VTI: 3.23 cm2
AV Area mean vel: 2.82 cm2
AV Mean grad: 3 mmHg
AV Peak grad: 5.4 mmHg
Ao pk vel: 1.16 m/s
Area-P 1/2: 3.89 cm2
Calc EF: 61 %
S' Lateral: 2.9 cm
Single Plane A2C EF: 63.5 %
Single Plane A4C EF: 59.8 %

## 2023-07-31 NOTE — Progress Notes (Signed)
  Echocardiogram 2D Echocardiogram has been performed.  Annis Kinder, RDCS 07/31/2023, 1:40 PM

## 2023-08-01 ENCOUNTER — Ambulatory Visit (HOSPITAL_COMMUNITY)
Admission: RE | Admit: 2023-08-01 | Discharge: 2023-08-01 | Disposition: A | Discharge status: Home / Self Care | Source: Ambulatory Visit | Attending: Internal Medicine | Admitting: Internal Medicine

## 2023-08-01 VITALS — BP 122/72 | HR 85 | Ht 66.5 in | Wt 175.8 lb

## 2023-08-01 DIAGNOSIS — I4819 Other persistent atrial fibrillation: Secondary | ICD-10-CM

## 2023-08-01 DIAGNOSIS — I4891 Unspecified atrial fibrillation: Secondary | ICD-10-CM

## 2023-08-01 DIAGNOSIS — I4892 Unspecified atrial flutter: Secondary | ICD-10-CM

## 2023-08-01 NOTE — Progress Notes (Signed)
 Primary Care Physician: Victorio Grave, MD Primary Cardiologist: None Electrophysiologist: None     Referring Physician: Dr. Jacinto Martins is a 78 y.o. female with a history of asthma, HLD, osteopenia, PVCs, and atrial flutter who presents for consultation in the [facility]. Seen by PCP on 06/19/23 for recent URI (took prednisone) and found to be in new onset atrial flutter. Started on diltiazem 120 mg daily. Patient is on Eliquis 5 mg BID for a CHADS2VASC score of 3.  On follow up 07/09/23, she is currently in atrial flutter with RVR. We could not begin rate control at last visit due to low blood pressure. She notes BP at home runs typically right around 100 systolic but several readings are in the 90s. She began Eliquis 5 mg BID at last visit after discussion; no missed doses. She took two doses of Eliquis on 3/20.   On follow up 08/01/23, she is currently in NSR. S/p successful DCCV on 07/16/23. No missed doses of Eliquis. She feels much better in normal rhythm.   Today, she denies symptoms of palpitations,  orthopnea, PND, lower extremity edema, dizziness, snoring, daytime somnolence, bleeding, or neurologic sequela. The patient is tolerating medications without difficulties and is otherwise without complaint today.    she has a BMI of Body mass index is 27.95 kg/m.Aaron Aas Filed Weights   08/01/23 0828  Weight: 79.7 kg    Current Outpatient Medications  Medication Sig Dispense Refill   acetaminophen  (TYLENOL ) 500 MG tablet Take 500-1,000 mg by mouth as needed for moderate pain (pain score 4-6) or headache.     carboxymethylcellulose (REFRESH PLUS) 0.5 % SOLN Place 1 drop into both eyes daily as needed (dry eyes).     Cholecalciferol (HM VITAMIN D3) 2000 units CAPS Take 4,000 Units by mouth daily.     ELIQUIS 5 MG TABS tablet Take 5 mg by mouth 2 (two) times daily.     escitalopram (LEXAPRO) 10 MG tablet Take 5 mg by mouth daily.     Multiple  Vitamins-Minerals (MULTIVITAMIN PO) Take 1 tablet by mouth daily.     PROAIR  HFA 108 (90 Base) MCG/ACT inhaler Inhale 1-2 puffs into the lungs every 4 (four) hours as needed for shortness of breath or wheezing.  1   vitamin B-12 (CYANOCOBALAMIN) 500 MCG tablet Take 500 mcg by mouth daily.     No current facility-administered medications for this encounter.    Atrial Fibrillation Management history:  Previous antiarrhythmic drugs: none Previous cardioversions: 07/16/23 Previous ablations: none Anticoagulation history: Eliquis 5 mg BID   ROS- All systems are reviewed and negative except as per the HPI above.  Physical Exam: Ht 5' 6.5" (1.689 m)   Wt 79.7 kg   BMI 27.95 kg/m   GEN- The patient is well appearing, alert and oriented x 3 today.   Neck - no JVD or carotid bruit noted Lungs- Clear to ausculation bilaterally, normal work of breathing Heart- Regular rate and rhythm, no murmurs, rubs or gallops, PMI not laterally displaced Extremities- no clubbing, cyanosis, or edema Skin - no rash or ecchymosis noted   EKG today demonstrates  Vent. rate 85 BPM PR interval 166 ms QRS duration 82 ms QT/QTcB 362/430 ms P-R-T axes 73 59 68 Normal sinus rhythm Normal ECG When compared with ECG of 16-Jul-2023 09:57, No significant change was found  Echo 07/31/23: 1. Left ventricular ejection fraction, by estimation, is 55 to 60%. Left  ventricular ejection fraction  by 2D MOD biplane is 61.0 %. The left  ventricle has normal function. The left ventricle has no regional wall  motion abnormalities. Left ventricular  diastolic parameters are consistent with Grade I diastolic dysfunction  (impaired relaxation).   2. Right ventricular systolic function is normal. The right ventricular  size is normal. Tricuspid regurgitation signal is inadequate for assessing  PA pressure.   3. The mitral valve is normal in structure. No evidence of mitral valve  regurgitation. No evidence of mitral  stenosis.   4. The aortic valve is tricuspid. Aortic valve regurgitation is not  visualized. No aortic stenosis is present.   5. The inferior vena cava is normal in size with greater than 50%  respiratory variability, suggesting right atrial pressure of 3 mmHg.   ASSESSMENT & PLAN CHA2DS2-VASc Score = 3  The patient's score is based upon: CHF History: 0 HTN History: 0 Diabetes History: 0 Stroke History: 0 Vascular Disease History: 0 Age Score: 2 Gender Score: 1       ASSESSMENT AND PLAN: Persistent Atrial Flutter The patient's CHA2DS2-VASc score is 3, indicating a 3.2% annual risk of stroke.   S/p DCCV on 07/16/23.  She is currently in NSR.   Secondary Hypercoagulable State (ICD10:  D68.69) The patient is at significant risk for stroke/thromboembolism based upon her CHA2DS2-VASc Score of 3.   Continue Eliquis 5 mg BID without interruption. We briefly discussed Watchman procedure vs ILR if she wishes to come off Eliquis in the future.   Follow up 1 year Afib clinic.    Minnie Amber, PA-C  Afib Clinic [facility] [address] [place], [place] [address] [phone]

## 2023-08-16 DIAGNOSIS — F320 Major depressive disorder, single episode, mild: Secondary | ICD-10-CM | POA: Diagnosis not present

## 2023-08-16 DIAGNOSIS — F4323 Adjustment disorder with mixed anxiety and depressed mood: Secondary | ICD-10-CM | POA: Diagnosis not present

## 2023-08-16 DIAGNOSIS — F39 Unspecified mood [affective] disorder: Secondary | ICD-10-CM | POA: Diagnosis not present

## 2023-08-16 DIAGNOSIS — I4892 Unspecified atrial flutter: Secondary | ICD-10-CM | POA: Diagnosis not present

## 2023-08-16 DIAGNOSIS — E049 Nontoxic goiter, unspecified: Secondary | ICD-10-CM | POA: Diagnosis not present

## 2023-09-10 DIAGNOSIS — L570 Actinic keratosis: Secondary | ICD-10-CM | POA: Diagnosis not present

## 2023-09-10 DIAGNOSIS — D2271 Melanocytic nevi of right lower limb, including hip: Secondary | ICD-10-CM | POA: Diagnosis not present

## 2023-09-10 DIAGNOSIS — L820 Inflamed seborrheic keratosis: Secondary | ICD-10-CM | POA: Diagnosis not present

## 2023-09-10 DIAGNOSIS — D485 Neoplasm of uncertain behavior of skin: Secondary | ICD-10-CM | POA: Diagnosis not present

## 2023-09-10 DIAGNOSIS — D2261 Melanocytic nevi of right upper limb, including shoulder: Secondary | ICD-10-CM | POA: Diagnosis not present

## 2023-09-10 DIAGNOSIS — D225 Melanocytic nevi of trunk: Secondary | ICD-10-CM | POA: Diagnosis not present

## 2023-09-10 DIAGNOSIS — L821 Other seborrheic keratosis: Secondary | ICD-10-CM | POA: Diagnosis not present

## 2023-09-21 ENCOUNTER — Other Ambulatory Visit (HOSPITAL_COMMUNITY): Payer: Self-pay | Admitting: *Deleted

## 2023-09-21 MED ORDER — ELIQUIS 5 MG PO TABS: 5.0000 mg | ORAL_TABLET | Freq: Two times a day (BID) | ORAL | 6 refills | Status: DC

## 2023-10-01 DIAGNOSIS — E042 Nontoxic multinodular goiter: Secondary | ICD-10-CM | POA: Diagnosis not present

## 2023-10-01 DIAGNOSIS — E049 Nontoxic goiter, unspecified: Secondary | ICD-10-CM | POA: Diagnosis not present

## 2023-12-11 ENCOUNTER — Ambulatory Visit (HOSPITAL_COMMUNITY)
Admission: RE | Admit: 2023-12-11 | Discharge: 2023-12-11 | Disposition: A | Discharge status: Home / Self Care | Source: Ambulatory Visit | Attending: Internal Medicine | Admitting: Internal Medicine

## 2023-12-11 VITALS — BP 98/78 | HR 139 | Ht 66.5 in | Wt 176.6 lb

## 2023-12-11 DIAGNOSIS — I4891 Unspecified atrial fibrillation: Secondary | ICD-10-CM | POA: Diagnosis not present

## 2023-12-11 DIAGNOSIS — I484 Atypical atrial flutter: Secondary | ICD-10-CM

## 2023-12-11 DIAGNOSIS — D6869 Other thrombophilia: Secondary | ICD-10-CM | POA: Diagnosis not present

## 2023-12-11 NOTE — Progress Notes (Signed)
 Primary Care Physician: Teresa Channel, MD Primary Cardiologist: None Electrophysiologist: None  Referring Physician: Dr. Teresa Aleck Heather Fitzpatrick is a 78 y.o. female with a history of asthma, HLD, osteopenia, PVCs, and atrial flutter who presents for follow up in the [facility]. Seen by PCP on 06/19/23 for recent URI (took prednisone) and found to be in new onset atrial flutter. Started on diltiazem 120 mg daily. Patient is on Eliquis  5 mg BID for a CHADS2VASC score of 3.  On follow up 07/09/23, she is currently in atrial flutter with RVR. We could not begin rate control at last visit due to low blood pressure. She notes BP at home runs typically right around 100 systolic but several readings are in the 90s. She began Eliquis  5 mg BID at last visit after discussion; no missed doses. She took two doses of Eliquis  on 3/20.   On follow up 08/01/23, she is currently in NSR. S/p successful DCCV on 07/16/23. No missed doses of Eliquis . She feels much better in normal rhythm.   Follow up 12/11/23. Patient returns for follow up for atrial flutter. She reports that she went out of rhythm on 12/08/23. There were no specific triggers that she could identify. She has symptoms of fatigue and heart racing.   Today, she  denies symptoms of chest pain, shortness of breath, orthopnea, PND, lower extremity edema, dizziness, presyncope, syncope, snoring, daytime somnolence, bleeding, or neurologic sequela. The patient is tolerating medications without difficulties and is otherwise without complaint today.    she has a BMI of Body mass index is 28.08 kg/m.Heather Fitzpatrick Filed Weights   12/11/23 1025  Weight: 80.1 kg    Current Outpatient Medications  Medication Sig Dispense Refill   acetaminophen  (TYLENOL ) 500 MG tablet Take 500-1,000 mg by mouth as needed for moderate pain (pain score 4-6) or headache.     carboxymethylcellulose (REFRESH PLUS) 0.5 % SOLN Place 1 drop into both eyes daily as  needed (dry eyes).     Cholecalciferol (HM VITAMIN D3) 2000 units CAPS Take 4,000 Units by mouth daily.     ELIQUIS  5 MG TABS tablet Take 1 tablet (5 mg total) by mouth 2 (two) times daily. 60 tablet 6   escitalopram (LEXAPRO) 10 MG tablet Take 5 mg by mouth daily.     Multiple Vitamins-Minerals (MULTIVITAMIN PO) Take 1 tablet by mouth daily.     PROAIR  HFA 108 (90 Base) MCG/ACT inhaler Inhale 1-2 puffs into the lungs every 4 (four) hours as needed for shortness of breath or wheezing.  1   vitamin B-12 (CYANOCOBALAMIN) 500 MCG tablet Take 500 mcg by mouth daily.     No current facility-administered medications for this encounter.    Atrial Fibrillation Management history:  Previous antiarrhythmic drugs: none Previous cardioversions: 07/16/23 Previous ablations: none Anticoagulation history: Eliquis  5 mg BID   ROS- All systems are reviewed and negative except as per the HPI above.  Physical Exam: BP 98/78   Pulse (!) 139   Ht 5' 6.5 (1.689 m)   Wt 80.1 kg   BMI 28.08 kg/m   GEN: Well nourished, well developed in no acute distress CARDIAC: Irregularly irregular rate and rhythm, no murmurs, rubs, gallops RESPIRATORY:  Clear to auscultation without rales, wheezing or rhonchi  ABDOMEN: Soft, non-tender, non-distended EXTREMITIES:  No edema; No deformity    EKG today demonstrates  Atypical atrial flutter Vent. rate 139 BPM PR interval 138 ms QRS duration 88 ms QT/QTcB  278/423 ms   Echo 07/31/23: 1. Left ventricular ejection fraction, by estimation, is 55 to 60%. Left  ventricular ejection fraction by 2D MOD biplane is 61.0 %. The left  ventricle has normal function. The left ventricle has no regional wall  motion abnormalities. Left ventricular diastolic parameters are consistent with Grade I diastolic dysfunction (impaired relaxation).   2. Right ventricular systolic function is normal. The right ventricular  size is normal. Tricuspid regurgitation signal is inadequate for  assessing  PA pressure.   3. The mitral valve is normal in structure. No evidence of mitral valve  regurgitation. No evidence of mitral stenosis.   4. The aortic valve is tricuspid. Aortic valve regurgitation is not  visualized. No aortic stenosis is present.   5. The inferior vena cava is normal in size with greater than 50%  respiratory variability, suggesting right atrial pressure of 3 mmHg.    CHA2DS2-VASc Score = 3  The patient's score is based upon: CHF History: 0 HTN History: 0 Diabetes History: 0 Stroke History: 0 Vascular Disease History: 0 Age Score: 2 Gender Score: 1       ASSESSMENT AND PLAN: Atrial flutter The patient's CHA2DS2-VASc score is 3, indicating a 3.2% annual risk of stroke.   Patient back in atrial flutter with elevated rates.  We discussed rhythm control options today. Will plan for DCCV. Check bmet/cbc.  Long term, we discussed AAD and ablation. She is currently on escitalopram which interacts with several AADs. She wants to consider her options and discuss them again after her DCCV.  Continue Eliquis  5 mg BID  Secondary Hypercoagulable State (ICD10:  D68.69) The patient is at significant risk for stroke/thromboembolism based upon her CHA2DS2-VASc Score of 3.  Continue Apixaban  (Eliquis ). No bleeding issues.       Follow up with Thom Heinrich post DCCV.    Informed Consent   Shared Decision Making/Informed Consent The risks (stroke, cardiac arrhythmias rarely resulting in the need for a temporary or permanent pacemaker, skin irritation or burns and complications associated with conscious sedation including aspiration, arrhythmia, respiratory failure and death), benefits (restoration of normal sinus rhythm) and alternatives of a direct current cardioversion were explained in detail to Ms. Gelin and she agrees to proceed.       Daril Kicks PA-C Afib Clinic [facility] [address] [place], [place] [address] [phone]

## 2023-12-11 NOTE — Patient Instructions (Addendum)
 Cardioversion scheduled for: Wednesday, September 10th   - Arrive at the [facility] ([address])  and check in with ADMITTING at 1:00PM   - Do not eat or drink anything after midnight the night prior to your procedure.   - Take all your morning medication (except diabetic medications) with a sip of water  prior to arrival.  - Do NOT miss any doses of your blood thinner - if you should miss a dose or take a dose more than 4 hours late -- please notify our office immediately.  - You will not be able to drive home after your procedure. Please ensure you have a responsible adult to drive you home. You will need someone with you for 24 hours post procedure.     - Expect to be in the procedural area approximately 2 hours.   - If you feel as if you go back into normal rhythm prior to scheduled cardioversion, please notify our office immediately.   If your procedure is canceled in the cardioversion suite you will be charged a cancellation fee.

## 2023-12-12 ENCOUNTER — Ambulatory Visit (HOSPITAL_COMMUNITY): Payer: Self-pay | Admitting: Internal Medicine

## 2023-12-12 ENCOUNTER — Encounter (HOSPITAL_COMMUNITY)
Admission: RE | Disposition: A | Discharge status: Home / Self Care | Payer: Self-pay | Source: Home / Self Care | Attending: Cardiovascular Disease

## 2023-12-12 ENCOUNTER — Ambulatory Visit (HOSPITAL_COMMUNITY): Admitting: Anesthesiology

## 2023-12-12 ENCOUNTER — Ambulatory Visit (HOSPITAL_COMMUNITY)
Admission: RE | Admit: 2023-12-12 | Discharge: 2023-12-12 | Disposition: A | Discharge status: Home / Self Care | Attending: Cardiovascular Disease | Admitting: Cardiovascular Disease

## 2023-12-12 ENCOUNTER — Encounter (HOSPITAL_COMMUNITY): Payer: Self-pay | Admitting: Cardiovascular Disease

## 2023-12-12 ENCOUNTER — Other Ambulatory Visit: Payer: Self-pay

## 2023-12-12 DIAGNOSIS — I4819 Other persistent atrial fibrillation: Secondary | ICD-10-CM | POA: Insufficient documentation

## 2023-12-12 DIAGNOSIS — Z7901 Long term (current) use of anticoagulants: Secondary | ICD-10-CM | POA: Diagnosis not present

## 2023-12-12 DIAGNOSIS — I4891 Unspecified atrial fibrillation: Secondary | ICD-10-CM | POA: Diagnosis not present

## 2023-12-12 HISTORY — PX: CARDIOVERSION: EP1203

## 2023-12-12 LAB — CBC
Hematocrit: 38.8 % (ref 34.0–46.6)
Hemoglobin: 12.2 g/dL (ref 11.1–15.9)
MCH: 26.7 pg (ref 26.6–33.0)
MCHC: 31.4 g/dL — ABNORMAL LOW (ref 31.5–35.7)
MCV: 85 fL (ref 79–97)
Platelets: 306 x10E3/uL (ref 150–450)
RBC: 4.57 x10E6/uL (ref 3.77–5.28)
RDW: 14.1 % (ref 11.7–15.4)
WBC: 4.7 x10E3/uL (ref 3.4–10.8)

## 2023-12-12 LAB — BASIC METABOLIC PANEL WITH GFR
BUN/Creatinine Ratio: 20 (ref 12–28)
BUN: 19 mg/dL (ref 8–27)
CO2: 23 mmol/L (ref 20–29)
Calcium: 9.5 mg/dL (ref 8.7–10.3)
Chloride: 103 mmol/L (ref 96–106)
Creatinine, Ser: 0.96 mg/dL (ref 0.57–1.00)
Glucose: 76 mg/dL (ref 70–99)
Potassium: 4.3 mmol/L (ref 3.5–5.2)
Sodium: 139 mmol/L (ref 134–144)
eGFR: 61 mL/min/1.73 (ref >59)

## 2023-12-12 SURGERY — CARDIOVERSION (CATH LAB)
Anesthesia: General

## 2023-12-12 MED ORDER — PROPOFOL 10 MG/ML IV BOLUS
INTRAVENOUS | Status: DC | PRN
  Administered 2023-12-12: 40 mg via INTRAVENOUS

## 2023-12-12 MED ORDER — SODIUM CHLORIDE 0.9 % IV SOLN: INTRAVENOUS | Status: DC

## 2023-12-12 SURGICAL SUPPLY — 1 items: PAD DEFIB RADIO PHYSIO CONN (PAD) ×1 IMPLANT

## 2023-12-12 NOTE — CV Procedure (Signed)
 Electrical Cardioversion Procedure Note Heather Fitzpatrick [id number] Oct 02, 1945  Procedure: Electrical Cardioversion Indications:  Atrial Fibrillation  Procedure Details Consent: Risks of procedure as well as the alternatives and risks of each were explained to the (patient/caregiver).  Consent for procedure obtained. Time Out: Verified patient identification, verified procedure, site/side was marked, verified correct patient position, special equipment/implants available, medications/allergies/relevent history reviewed, required imaging and test results available.  Performed  Patient placed on cardiac monitor, pulse oximetry, supplemental oxygen as necessary.  Sedation given: propofol  Pacer pads placed anterior and posterior chest.  Cardioverted 1 time(s).  Cardioverted at 200J.  Evaluation Findings: Post procedure EKG shows: NSR Complications: None Patient did tolerate procedure well.   Heather Scarce, MD 12/12/2023, 1:19 PM

## 2023-12-12 NOTE — Transfer of Care (Signed)
 Immediate Anesthesia Transfer of Care Note  Patient: Heather Fitzpatrick  Procedure(s) Performed: CARDIOVERSION  Patient Location: Cath Lab  Anesthesia Type:General  Level of Consciousness: awake, alert , and oriented  Airway & Oxygen Therapy: Patient Spontanous Breathing and Patient connected to nasal cannula oxygen  Post-op Assessment: Report given to RN and Post -op Vital signs reviewed and stable  Post vital signs: Reviewed and stable  Last Vitals:  Vitals Value Taken Time  BP 122/75 1316  Temp 96 1316  Pulse 78 1316  Resp 16 1316  SpO2 98 1316    Last Pain:  Vitals:   12/12/23 1149  TempSrc:   PainSc: 0-No pain         Complications: No notable events documented.

## 2023-12-18 NOTE — Anesthesia Preprocedure Evaluation (Signed)
 Anesthesia Evaluation  Patient identified by MRN, date of birth, ID band Patient awake    Reviewed: Allergy & Precautions, NPO status , Patient's Chart, lab work & pertinent test results  Airway Mallampati: II  TM Distance: >3 FB     Dental  (+) Dental Advisory Given, Caps   Pulmonary asthma    breath sounds clear to auscultation       Cardiovascular + dysrhythmias Atrial Fibrillation  Rhythm:Irregular     Neuro/Psych  PSYCHIATRIC DISORDERS  Depression    negative neurological ROS     GI/Hepatic negative GI ROS, Neg liver ROS,,,  Endo/Other  negative endocrine ROS    Renal/GU negative Renal ROS  negative genitourinary   Musculoskeletal  (+) Arthritis , Osteoarthritis,    Abdominal   Peds  Hematology Eliquis  therapy- last dose this am   Anesthesia Other Findings   Reproductive/Obstetrics                              Anesthesia Physical Anesthesia Plan  ASA: 2  Anesthesia Plan: General   Post-op Pain Management: Minimal or no pain anticipated   Induction: Intravenous  PONV Risk Score and Plan: 3 and Treatment may vary due to age or medical condition  Airway Management Planned: Natural Airway, Mask, Simple Face Mask and Nasal Cannula  Additional Equipment: None  Intra-op Plan:   Post-operative Plan:   Informed Consent: I have reviewed the patients History and Physical, chart, labs and discussed the procedure including the risks, benefits and alternatives for the proposed anesthesia with the patient or authorized representative who has indicated his/her understanding and acceptance.     Dental advisory given  Plan Discussed with: CRNA  Anesthesia Plan Comments:          Anesthesia Quick Evaluation

## 2023-12-18 NOTE — Anesthesia Postprocedure Evaluation (Signed)
 Anesthesia Post Note  Patient: Heather Fitzpatrick  Procedure(s) Performed: CARDIOVERSION     Patient location during evaluation: Cath Lab Anesthesia Type: General Level of consciousness: awake and alert Pain management: pain level controlled Vital Signs Assessment: post-procedure vital signs reviewed and stable Respiratory status: spontaneous breathing, nonlabored ventilation and respiratory function stable Cardiovascular status: blood pressure returned to baseline and stable Postop Assessment: no apparent nausea or vomiting Anesthetic complications: no   There were no known notable events for this encounter.                  Kyden Potash

## 2023-12-26 ENCOUNTER — Ambulatory Visit (HOSPITAL_COMMUNITY)
Admission: RE | Admit: 2023-12-26 | Discharge: 2023-12-26 | Disposition: A | Discharge status: Home / Self Care | Source: Ambulatory Visit | Attending: Internal Medicine | Admitting: Internal Medicine

## 2023-12-26 VITALS — BP 124/66 | HR 71 | Ht 65.0 in | Wt 174.0 lb

## 2023-12-26 DIAGNOSIS — I4891 Unspecified atrial fibrillation: Secondary | ICD-10-CM | POA: Diagnosis not present

## 2023-12-26 DIAGNOSIS — D6869 Other thrombophilia: Secondary | ICD-10-CM

## 2023-12-26 DIAGNOSIS — I484 Atypical atrial flutter: Secondary | ICD-10-CM

## 2023-12-26 MED ORDER — DILTIAZEM HCL 30 MG PO TABS
30.0000 mg | ORAL_TABLET | Freq: Three times a day (TID) | ORAL | 3 refills | Status: AC | PRN
Start: 2023-12-26 — End: 2024-12-25

## 2023-12-26 NOTE — Progress Notes (Signed)
 Primary Care Physician: Teresa Channel, MD Primary Cardiologist: None Electrophysiologist: None  Referring Physician: Dr. Teresa Aleck Heather Fitzpatrick is a 78 y.o. female with a history of asthma, HLD, osteopenia, PVCs, and atrial flutter who presents for follow up in the [facility]. Seen by PCP on 06/19/23 for recent URI (took prednisone) and found to be in new onset atrial flutter. Started on diltiazem  120 mg daily. Patient is on Eliquis  5 mg BID for a CHADS2VASC score of 3.  On follow up 07/09/23, she is currently in atrial flutter with RVR. We could not begin rate control at last visit due to low blood pressure. She notes BP at home runs typically right around 100 systolic but several readings are in the 90s. She began Eliquis  5 mg BID at last visit after discussion; no missed doses. She took two doses of Eliquis  on 3/20.   On follow up 08/01/23, she is currently in NSR. S/p successful DCCV on 07/16/23. No missed doses of Eliquis . She feels much better in normal rhythm.   Follow up 12/11/23. Patient returns for follow up for atrial flutter. She reports that she went out of rhythm on 12/08/23. There were no specific triggers that she could identify. She has symptoms of fatigue and heart racing.   Follow up 12/26/23. Patient is currently in NSR. Patient is s/p DCCV on 12/12/23. No missed doses of Eliquis .   Today, she  denies symptoms of chest pain, shortness of breath, orthopnea, PND, lower extremity edema, dizziness, presyncope, syncope, snoring, daytime somnolence, bleeding, or neurologic sequela. The patient is tolerating medications without difficulties and is otherwise without complaint today.    she has a BMI of Body mass index is 28.96 kg/m.Heather Fitzpatrick Filed Weights   12/26/23 1403  Weight: 78.9 kg     Current Outpatient Medications  Medication Sig Dispense Refill   acetaminophen  (TYLENOL ) 500 MG tablet Take 500-1,000 mg by mouth as needed for moderate pain (pain  score 4-6) or headache.     carboxymethylcellulose (REFRESH PLUS) 0.5 % SOLN Place 1 drop into both eyes daily as needed (dry eyes).     Cholecalciferol (HM VITAMIN D3) 2000 units CAPS Take 4,000 Units by mouth daily.     ELIQUIS  5 MG TABS tablet Take 1 tablet (5 mg total) by mouth 2 (two) times daily. 60 tablet 6   escitalopram (LEXAPRO) 5 MG tablet Take 5 mg by mouth daily.     Multiple Vitamins-Minerals (MULTIVITAMIN PO) Take 1 tablet by mouth daily.     PROAIR  HFA 108 (90 Base) MCG/ACT inhaler Inhale 1-2 puffs into the lungs every 4 (four) hours as needed for shortness of breath or wheezing.  1   vitamin B-12 (CYANOCOBALAMIN) 500 MCG tablet Take 500 mcg by mouth daily.     No current facility-administered medications for this encounter.    Atrial Fibrillation Management history:  Previous antiarrhythmic drugs: none Previous cardioversions: 07/16/23, 12/12/23 Previous ablations: none Anticoagulation history: Eliquis  5 mg BID   ROS- All systems are reviewed and negative except as per the HPI above.  Physical Exam: BP 124/66   Pulse 71   Ht 5' 5 (1.651 m)   Wt 78.9 kg   BMI 28.96 kg/m   GEN- The patient is well appearing, alert and oriented x 3 today.   Neck - no JVD or carotid bruit noted Lungs- Clear to ausculation bilaterally, normal work of breathing Heart- Regular rate and rhythm, no murmurs, rubs or gallops,  PMI not laterally displaced Extremities- no clubbing, cyanosis, or edema Skin - no rash or ecchymosis noted    EKG today demonstrates  Vent. rate 71 BPM PR interval 158 ms QRS duration 84 ms QT/QTcB 376/408 ms P-R-T axes 48 33 55 Normal sinus rhythm Cannot rule out Anterior infarct , age undetermined Abnormal ECG When compared with ECG of 12-Dec-2023 13:32, Premature atrial complexes are no longer Present   Echo 07/31/23: 1. Left ventricular ejection fraction, by estimation, is 55 to 60%. Left  ventricular ejection fraction by 2D MOD biplane is 61.0 %.  The left  ventricle has normal function. The left ventricle has no regional wall  motion abnormalities. Left ventricular diastolic parameters are consistent with Grade I diastolic dysfunction (impaired relaxation).   2. Right ventricular systolic function is normal. The right ventricular  size is normal. Tricuspid regurgitation signal is inadequate for assessing  PA pressure.   3. The mitral valve is normal in structure. No evidence of mitral valve  regurgitation. No evidence of mitral stenosis.   4. The aortic valve is tricuspid. Aortic valve regurgitation is not  visualized. No aortic stenosis is present.   5. The inferior vena cava is normal in size with greater than 50%  respiratory variability, suggesting right atrial pressure of 3 mmHg.    CHA2DS2-VASc Score = 3  The patient's score is based upon: CHF History: 0 HTN History: 0 Diabetes History: 0 Stroke History: 0 Vascular Disease History: 0 Age Score: 2 Gender Score: 1       ASSESSMENT AND PLAN: Atrial flutter The patient's CHA2DS2-VASc score is 3, indicating a 3.2% annual risk of stroke.   S/p DCCV on 12/12/23.  Patient is currently in NSR. We discussed briefly AAD options vs ablation if repeat cardioversion is needed in less than 12 months. Will send in diltiazem  30 mg PRN.   Secondary Hypercoagulable State (ICD10:  D68.69) The patient is at significant risk for stroke/thromboembolism based upon her CHA2DS2-VASc Score of 3.  Continue Apixaban  (Eliquis ).  Continue Eliquis .       Follow up Afib clinic in 6 months.   Dorn Heinrich, PA-C Afib Clinic [facility] [address] [place], [place] [address] [phone]

## 2023-12-26 NOTE — Patient Instructions (Signed)
 Diltiazem  30 mg as needed every 4-6 hours for elevated heart rate and SBP above 100

## 2024-02-26 DIAGNOSIS — E559 Vitamin D deficiency, unspecified: Secondary | ICD-10-CM | POA: Diagnosis not present

## 2024-02-26 DIAGNOSIS — Z23 Encounter for immunization: Secondary | ICD-10-CM | POA: Diagnosis not present

## 2024-02-26 DIAGNOSIS — Z0000 Encounter for general adult medical examination without abnormal findings: Secondary | ICD-10-CM | POA: Diagnosis not present

## 2024-02-26 DIAGNOSIS — J4520 Mild intermittent asthma, uncomplicated: Secondary | ICD-10-CM | POA: Diagnosis not present

## 2024-02-26 DIAGNOSIS — I4892 Unspecified atrial flutter: Secondary | ICD-10-CM | POA: Diagnosis not present

## 2024-02-26 DIAGNOSIS — F320 Major depressive disorder, single episode, mild: Secondary | ICD-10-CM | POA: Diagnosis not present

## 2024-02-26 DIAGNOSIS — E785 Hyperlipidemia, unspecified: Secondary | ICD-10-CM | POA: Diagnosis not present

## 2024-02-26 DIAGNOSIS — E049 Nontoxic goiter, unspecified: Secondary | ICD-10-CM | POA: Diagnosis not present

## 2024-02-26 DIAGNOSIS — F4323 Adjustment disorder with mixed anxiety and depressed mood: Secondary | ICD-10-CM | POA: Diagnosis not present

## 2024-02-26 DIAGNOSIS — M810 Age-related osteoporosis without current pathological fracture: Secondary | ICD-10-CM | POA: Diagnosis not present

## 2024-02-26 DIAGNOSIS — F39 Unspecified mood [affective] disorder: Secondary | ICD-10-CM | POA: Diagnosis not present

## 2024-03-31 ENCOUNTER — Telehealth (HOSPITAL_COMMUNITY): Payer: Self-pay | Admitting: *Deleted

## 2024-03-31 NOTE — Telephone Encounter (Signed)
 Patient back in afib as of 12/28 HRs in the 120s. Patient believes family stress may be the trigger for this episode of afib. She took a dose of cardizem  30mg  yesterday but only one time. Educated pt on frequency of cardizem  use depending on heart rates. She is out of town for several days - she will use PRN cardizem  and appt made when she returns. Pt in agreement. If she converts to NSR she will call and cancel appt.

## 2024-04-09 ENCOUNTER — Encounter (HOSPITAL_COMMUNITY): Payer: Self-pay | Admitting: Internal Medicine

## 2024-04-09 ENCOUNTER — Ambulatory Visit (HOSPITAL_COMMUNITY)
Admission: RE | Admit: 2024-04-09 | Discharge: 2024-04-09 | Disposition: A | Discharge status: Home / Self Care | Source: Ambulatory Visit | Attending: Internal Medicine | Admitting: Internal Medicine

## 2024-04-09 VITALS — BP 116/92 | HR 133 | Ht 65.0 in | Wt 180.7 lb

## 2024-04-09 DIAGNOSIS — I4892 Unspecified atrial flutter: Secondary | ICD-10-CM

## 2024-04-09 DIAGNOSIS — D6869 Other thrombophilia: Secondary | ICD-10-CM | POA: Diagnosis not present

## 2024-04-09 DIAGNOSIS — I4819 Other persistent atrial fibrillation: Secondary | ICD-10-CM

## 2024-04-09 NOTE — Progress Notes (Signed)
 "   Primary Care Physician: Heather Channel, MD Primary Cardiologist: None Electrophysiologist: None  Referring Physician: Dr. Teresa Fitzpatrick Heather Fitzpatrick is a 79 y.o. female with a history of asthma, HLD, osteopenia, PVCs, and atrial flutter who presents for follow up in the [facility]. Seen by PCP on 06/19/23 for recent URI (took prednisone) and found to be in new onset atrial flutter. Started on diltiazem  120 mg daily. Patient is on Eliquis  5 mg BID for a CHADS2VASC score of 3.  On follow up 07/09/23, she is currently in atrial flutter with RVR. We could not begin rate control at last visit due to low blood pressure. She notes BP at home runs typically right around 100 systolic but several readings are in the 90s. She began Eliquis  5 mg BID at last visit after discussion; no missed doses. She took two doses of Eliquis  on 3/20.   On follow up 08/01/23, she is currently in NSR. S/p successful DCCV on 07/16/23. No missed doses of Eliquis . She feels much better in normal rhythm.   Follow up 12/11/23. Patient returns for follow up for atrial flutter. She reports that she went out of rhythm on 12/08/23. There were no specific triggers that she could identify. She has symptoms of fatigue and heart racing.   Follow up 12/26/23. Patient is currently in NSR. Patient is s/p DCCV on 12/12/23. No missed doses of Eliquis .   Follow up 04/09/24. Patient is currently in atrial flutter with RVR.  Patient contacted office on 03/31/2024 noting she went back into A-fib and believes family stress may have been the trigger for this current episode.  She has been taking diltiazem  30 mg as needed.  She missed an entire day of Eliquis  on 12/27.  She feels tired and short of breath when out of rhythm.  Today, she  denies symptoms of chest pain, orthopnea, PND, lower extremity edema, dizziness, presyncope, syncope, snoring, daytime somnolence, bleeding, or neurologic sequela. The patient is tolerating  medications without difficulties and is otherwise without complaint today.    she has a BMI of Body mass index is 30.07 kg/m.Heather Filed Weights   04/09/24 0834  Weight: 82 kg     Current Outpatient Medications  Medication Sig Dispense Refill   acetaminophen  (TYLENOL ) 500 MG tablet Take 500-1,000 mg by mouth as needed for moderate pain (pain score 4-6) or headache.     carboxymethylcellulose (REFRESH PLUS) 0.5 % SOLN Place 1 drop into both eyes daily as needed (dry eyes).     Cholecalciferol (HM VITAMIN D3) 2000 units CAPS Take 4,000 Units by mouth daily.     diltiazem  (CARDIZEM ) 30 MG tablet Take 1 tablet (30 mg total) by mouth 3 (three) times daily as needed. If HR and SBP above 100 15 tablet 3   ELIQUIS  5 MG TABS tablet Take 1 tablet (5 mg total) by mouth 2 (two) times daily. 60 tablet 6   escitalopram (LEXAPRO) 10 MG tablet Take 10 mg by mouth daily.     Multiple Vitamins-Minerals (MULTIVITAMIN PO) Take 1 tablet by mouth daily.     PROAIR  HFA 108 (90 Base) MCG/ACT inhaler Inhale 1-2 puffs into the lungs every 4 (four) hours as needed for shortness of breath or wheezing.  1   vitamin B-12 (CYANOCOBALAMIN) 500 MCG tablet Take 500 mcg by mouth daily.     No current facility-administered medications for this encounter.    Atrial Fibrillation Management history:  Previous antiarrhythmic drugs: none  Previous cardioversions: 07/16/23, 12/12/23 Previous ablations: none Anticoagulation history: Eliquis  5 mg BID   ROS- All systems are reviewed and negative except as per the HPI above.  Physical Exam: BP (!) 116/92   Pulse (!) 133   Ht 5' 5 (1.651 m)   Wt 82 kg   BMI 30.07 kg/m   GEN- The patient is well appearing, alert and oriented x 3 today.   Neck - no JVD or carotid bruit noted Lungs- Clear to ausculation bilaterally, normal work of breathing Heart- Irregular tachycardic rate and rhythm, no murmurs, rubs or gallops, PMI not laterally displaced Extremities- no clubbing,  cyanosis, or edema Skin - no rash or ecchymosis noted   EKG today demonstrates  EKG Interpretation Date/Time:  Wednesday April 09 2024 08:37:36 EST Ventricular Rate:  133 PR Interval:    QRS Duration:  92 QT Interval:  396 QTC Calculation: 589 R Axis:   34  Text Interpretation: Atrial flutter with variable A-V block with premature ventricular or aberrantly conducted complexes Possible Inferior infarct , age undetermined Cannot rule out Anterior infarct (cited on or before 26-Dec-2023) When compared with ECG of 26-Dec-2023 14:17, PREVIOUS ECG IS PRESENT Confirmed by Heather Fitzpatrick ([id number]) on 04/09/2024 8:56:09 AM     Echo 07/31/23: 1. Left ventricular ejection fraction, by estimation, is 55 to 60%. Left  ventricular ejection fraction by 2D MOD biplane is 61.0 %. The left  ventricle has normal function. The left ventricle has no regional wall  motion abnormalities. Left ventricular diastolic parameters are consistent with Grade I diastolic dysfunction (impaired relaxation).   2. Right ventricular systolic function is normal. The right ventricular  size is normal. Tricuspid regurgitation signal is inadequate for assessing  PA pressure.   3. The mitral valve is normal in structure. No evidence of mitral valve  regurgitation. No evidence of mitral stenosis.   4. The aortic valve is tricuspid. Aortic valve regurgitation is not  visualized. No aortic stenosis is present.   5. The inferior vena cava is normal in size with greater than 50%  respiratory variability, suggesting right atrial pressure of 3 mmHg.    CHA2DS2-VASc Score = 3  The patient's score is based upon: CHF History: 0 HTN History: 0 Diabetes History: 0 Stroke History: 0 Vascular Disease History: 0 Age Score: 2 Gender Score: 1       ASSESSMENT AND PLAN: Atrial flutter The patient's CHA2DS2-VASc score is 3, indicating a 3.2% annual risk of stroke.   S/p DCCV on 12/12/23.  Patient is currently in atrial flutter with  RVR.  We had an extensive discussion about rhythm control options because this is her third cardioversion in less than 12 months.  Due to patient missing dose of Eliquis  on 12/27, we discussed TEE/DCCV as a procedural option to restore sinus rhythm quickly.  Patient is currently on Lexapro and would prefer to have patient transition away from this to a different mood disorder medication that would allow antiarrhythmic drug therapy.  After discussion, patient is likely to trial Multaq 400 mg twice daily.  This will take place after patient has been anticoagulated without interruption for at least 3 weeks and patient has appropriately transitioned away from Lexapro.We discussed the procedure cardioversion to try to convert to NSR. We discussed the risks vs benefits of this procedure and how ultimately we cannot predict whether a patient will have early return of arrhythmia post procedure. After discussion, the patient wishes to proceed with cardioversion. Labs drawn today.   Informed Consent  Shared Decision Making/Informed Consent   The risks [stroke, cardiac arrhythmias rarely resulting in the need for a temporary or permanent pacemaker, skin irritation or burns, esophageal damage, perforation (1:10,000 risk), bleeding, pharyngeal hematoma as well as other potential complications associated with conscious sedation including aspiration, arrhythmia, respiratory failure and death], benefits (treatment guidance, restoration of normal sinus rhythm, diagnostic support) and alternatives of a transesophageal echocardiogram guided cardioversion were discussed in detail with Ms. Fitzpatrick and she is willing to proceed.        Secondary Hypercoagulable State (ICD10:  D68.69) The patient is at significant risk for stroke/thromboembolism based upon her CHA2DS2-VASc Score of 3.  Continue Apixaban  (Eliquis ).  No missed doses of Eliquis  since 03/30/2024.      Follow up 2 to 3 weeks after DCCV.    Dorn Heinrich, PA-C [facility] [address] [place], [place] [address] [phone]  "

## 2024-04-09 NOTE — H&P (View-Only) (Signed)
 "   Primary Care Physician: Teresa Channel, MD Primary Cardiologist: None Electrophysiologist: None  Referring Physician: Dr. Teresa Aleck [name] is a 79 y.o. female with a history of asthma, HLD, osteopenia, PVCs, and atrial flutter who presents for follow up in the [facility]. Seen by PCP on 06/19/23 for recent URI (took prednisone) and found to be in new onset atrial flutter. Started on diltiazem  120 mg daily. Patient is on Eliquis  5 mg BID for a CHADS2VASC score of 3.  On follow up 07/09/23, she is currently in atrial flutter with RVR. We could not begin rate control at last visit due to low blood pressure. She notes BP at home runs typically right around 100 systolic but several readings are in the 90s. She began Eliquis  5 mg BID at last visit after discussion; no missed doses. She took two doses of Eliquis  on 3/20.   On follow up 08/01/23, she is currently in NSR. S/p successful DCCV on 07/16/23. No missed doses of Eliquis . She feels much better in normal rhythm.   Follow up 12/11/23. Patient returns for follow up for atrial flutter. She reports that she went out of rhythm on 12/08/23. There were no specific triggers that she could identify. She has symptoms of fatigue and heart racing.   Follow up 12/26/23. Patient is currently in NSR. Patient is s/p DCCV on 12/12/23. No missed doses of Eliquis .   Follow up 04/09/24. Patient is currently in atrial flutter with RVR.  Patient contacted office on 03/31/2024 noting she went back into A-fib and believes family stress may have been the trigger for this current episode.  She has been taking diltiazem  30 mg as needed.  She missed an entire day of Eliquis  on 12/27.  She feels tired and short of breath when out of rhythm.  Today, she  denies symptoms of chest pain, orthopnea, PND, lower extremity edema, dizziness, presyncope, syncope, snoring, daytime somnolence, bleeding, or neurologic sequela. The patient is tolerating  medications without difficulties and is otherwise without complaint today.    she has a BMI of Body mass index is 30.07 kg/m.SABRA Filed Weights   04/09/24 0834  Weight: 82 kg     Current Outpatient Medications  Medication Sig Dispense Refill   acetaminophen  (TYLENOL ) 500 MG tablet Take 500-1,000 mg by mouth as needed for moderate pain (pain score 4-6) or headache.     carboxymethylcellulose (REFRESH PLUS) 0.5 % SOLN Place 1 drop into both eyes daily as needed (dry eyes).     Cholecalciferol (HM VITAMIN D3) 2000 units CAPS Take 4,000 Units by mouth daily.     diltiazem  (CARDIZEM ) 30 MG tablet Take 1 tablet (30 mg total) by mouth 3 (three) times daily as needed. If HR and SBP above 100 15 tablet 3   ELIQUIS  5 MG TABS tablet Take 1 tablet (5 mg total) by mouth 2 (two) times daily. 60 tablet 6   escitalopram (LEXAPRO) 10 MG tablet Take 10 mg by mouth daily.     Multiple Vitamins-Minerals (MULTIVITAMIN PO) Take 1 tablet by mouth daily.     PROAIR  HFA 108 (90 Base) MCG/ACT inhaler Inhale 1-2 puffs into the lungs every 4 (four) hours as needed for shortness of breath or wheezing.  1   vitamin B-12 (CYANOCOBALAMIN) 500 MCG tablet Take 500 mcg by mouth daily.     No current facility-administered medications for this encounter.    Atrial Fibrillation Management history:  Previous antiarrhythmic drugs: none  Previous cardioversions: 07/16/23, 12/12/23 Previous ablations: none Anticoagulation history: Eliquis  5 mg BID   ROS- All systems are reviewed and negative except as per the HPI above.  Physical Exam: BP (!) 116/92   Pulse (!) 133   Ht 5' 5 (1.651 m)   Wt 82 kg   BMI 30.07 kg/m   GEN- The patient is well appearing, alert and oriented x 3 today.   Neck - no JVD or carotid bruit noted Lungs- Clear to ausculation bilaterally, normal work of breathing Heart- Irregular tachycardic rate and rhythm, no murmurs, rubs or gallops, PMI not laterally displaced Extremities- no clubbing,  cyanosis, or edema Skin - no rash or ecchymosis noted   EKG today demonstrates  EKG Interpretation Date/Time:  Wednesday April 09 2024 08:37:36 EST Ventricular Rate:  133 PR Interval:    QRS Duration:  92 QT Interval:  396 QTC Calculation: 589 R Axis:   34  Text Interpretation: Atrial flutter with variable A-V block with premature ventricular or aberrantly conducted complexes Possible Inferior infarct , age undetermined Cannot rule out Anterior infarct (cited on or before 26-Dec-2023) When compared with ECG of 26-Dec-2023 14:17, PREVIOUS ECG IS PRESENT Confirmed by Terra Pac ([id number]) on 04/09/2024 8:56:09 AM     Echo 07/31/23: 1. Left ventricular ejection fraction, by estimation, is 55 to 60%. Left  ventricular ejection fraction by 2D MOD biplane is 61.0 %. The left  ventricle has normal function. The left ventricle has no regional wall  motion abnormalities. Left ventricular diastolic parameters are consistent with Grade I diastolic dysfunction (impaired relaxation).   2. Right ventricular systolic function is normal. The right ventricular  size is normal. Tricuspid regurgitation signal is inadequate for assessing  PA pressure.   3. The mitral valve is normal in structure. No evidence of mitral valve  regurgitation. No evidence of mitral stenosis.   4. The aortic valve is tricuspid. Aortic valve regurgitation is not  visualized. No aortic stenosis is present.   5. The inferior vena cava is normal in size with greater than 50%  respiratory variability, suggesting right atrial pressure of 3 mmHg.    CHA2DS2-VASc Score = 3  The patient's score is based upon: CHF History: 0 HTN History: 0 Diabetes History: 0 Stroke History: 0 Vascular Disease History: 0 Age Score: 2 Gender Score: 1       ASSESSMENT AND PLAN: Atrial flutter The patient's CHA2DS2-VASc score is 3, indicating a 3.2% annual risk of stroke.   S/p DCCV on 12/12/23.  Patient is currently in atrial flutter with  RVR.  We had an extensive discussion about rhythm control options because this is her third cardioversion in less than 12 months.  Due to patient missing dose of Eliquis  on 12/27, we discussed TEE/DCCV as a procedural option to restore sinus rhythm quickly.  Patient is currently on Lexapro and would prefer to have patient transition away from this to a different mood disorder medication that would allow antiarrhythmic drug therapy.  After discussion, patient is likely to trial Multaq 400 mg twice daily.  This will take place after patient has been anticoagulated without interruption for at least 3 weeks and patient has appropriately transitioned away from Lexapro.We discussed the procedure cardioversion to try to convert to NSR. We discussed the risks vs benefits of this procedure and how ultimately we cannot predict whether a patient will have early return of arrhythmia post procedure. After discussion, the patient wishes to proceed with cardioversion. Labs drawn today.   Informed Consent  Shared Decision Making/Informed Consent   The risks [stroke, cardiac arrhythmias rarely resulting in the need for a temporary or permanent pacemaker, skin irritation or burns, esophageal damage, perforation (1:10,000 risk), bleeding, pharyngeal hematoma as well as other potential complications associated with conscious sedation including aspiration, arrhythmia, respiratory failure and death], benefits (treatment guidance, restoration of normal sinus rhythm, diagnostic support) and alternatives of a transesophageal echocardiogram guided cardioversion were discussed in detail with Ms. Livesey and she is willing to proceed.        Secondary Hypercoagulable State (ICD10:  D68.69) The patient is at significant risk for stroke/thromboembolism based upon her CHA2DS2-VASc Score of 3.  Continue Apixaban  (Eliquis ).  No missed doses of Eliquis  since 03/30/2024.      Follow up 2 to 3 weeks after DCCV.    Dorn Heinrich, PA-C [facility] [address] [place], [place] [address] [phone]  "

## 2024-04-09 NOTE — Patient Instructions (Addendum)
 Will need to transition off Lexpro talk your prescribing provider for this in order to move forward with other medication to control Afib    Cardioversion scheduled for: 04/11/24 Friday at 8:00 am    - Arrive at the [facility] ([address])  and check in with ADMITTING at 8:00 AM    - Do not eat or drink anything after midnight the night prior to your procedure.   - Take all your morning medication (except diabetic medications) with a sip of water  prior to arrival.  - Do NOT miss any doses of your blood thinner - if you should miss a dose or take a dose more than 4 hours late -- please notify our office immediately.  - You will not be able to drive home after your procedure. Please ensure you have a responsible adult to drive you home. You will need someone with you for 24 hours post procedure.     - Expect to be in the procedural area approximately 2 hours.   - If you feel as if you go back into normal rhythm prior to scheduled cardioversion, please notify our office immediately.   If your procedure is canceled in the cardioversion suite you will be charged a cancellation fee.

## 2024-04-10 ENCOUNTER — Ambulatory Visit (HOSPITAL_COMMUNITY): Payer: Self-pay | Admitting: Internal Medicine

## 2024-04-10 LAB — CBC
Hematocrit: 39.9 % (ref 34.0–46.6)
Hemoglobin: 12.2 g/dL (ref 11.1–15.9)
MCH: 26.8 pg (ref 26.6–33.0)
MCHC: 30.6 g/dL — ABNORMAL LOW (ref 31.5–35.7)
MCV: 88 fL (ref 79–97)
Platelets: 305 x10E3/uL (ref 150–450)
RBC: 4.56 x10E6/uL (ref 3.77–5.28)
RDW: 14.1 % (ref 11.7–15.4)
WBC: 4.3 x10E3/uL (ref 3.4–10.8)

## 2024-04-10 LAB — BASIC METABOLIC PANEL WITH GFR
BUN/Creatinine Ratio: 19 (ref 12–28)
BUN: 19 mg/dL (ref 8–27)
CO2: 23 mmol/L (ref 20–29)
Calcium: 9.6 mg/dL (ref 8.7–10.3)
Chloride: 102 mmol/L (ref 96–106)
Creatinine, Ser: 1.02 mg/dL — ABNORMAL HIGH (ref 0.57–1.00)
Glucose: 82 mg/dL (ref 70–99)
Potassium: 4.4 mmol/L (ref 3.5–5.2)
Sodium: 142 mmol/L (ref 134–144)
eGFR: 57 mL/min/1.73 — ABNORMAL LOW

## 2024-04-11 ENCOUNTER — Ambulatory Visit (HOSPITAL_COMMUNITY)

## 2024-04-11 ENCOUNTER — Encounter (HOSPITAL_COMMUNITY)
Admission: RE | Disposition: A | Discharge status: Home / Self Care | Payer: Self-pay | Source: Home / Self Care | Attending: Cardiovascular Disease

## 2024-04-11 ENCOUNTER — Encounter (HOSPITAL_COMMUNITY): Payer: Self-pay | Admitting: Cardiovascular Disease

## 2024-04-11 ENCOUNTER — Ambulatory Visit (HOSPITAL_COMMUNITY)
Admission: RE | Admit: 2024-04-11 | Discharge: 2024-04-11 | Disposition: A | Discharge status: Home / Self Care | Attending: Cardiovascular Disease | Admitting: Cardiovascular Disease

## 2024-04-11 ENCOUNTER — Other Ambulatory Visit: Payer: Self-pay

## 2024-04-11 DIAGNOSIS — I4819 Other persistent atrial fibrillation: Secondary | ICD-10-CM | POA: Diagnosis present

## 2024-04-11 DIAGNOSIS — I4891 Unspecified atrial fibrillation: Secondary | ICD-10-CM

## 2024-04-11 DIAGNOSIS — F39 Unspecified mood [affective] disorder: Secondary | ICD-10-CM | POA: Insufficient documentation

## 2024-04-11 DIAGNOSIS — I4892 Unspecified atrial flutter: Secondary | ICD-10-CM | POA: Insufficient documentation

## 2024-04-11 DIAGNOSIS — Z79899 Other long term (current) drug therapy: Secondary | ICD-10-CM | POA: Insufficient documentation

## 2024-04-11 DIAGNOSIS — J45909 Unspecified asthma, uncomplicated: Secondary | ICD-10-CM | POA: Diagnosis not present

## 2024-04-11 DIAGNOSIS — Z7901 Long term (current) use of anticoagulants: Secondary | ICD-10-CM | POA: Insufficient documentation

## 2024-04-11 HISTORY — PX: CARDIOVERSION: EP1203

## 2024-04-11 HISTORY — PX: TRANSESOPHAGEAL ECHOCARDIOGRAM (CATH LAB): EP1270

## 2024-04-11 LAB — ECHO TEE

## 2024-04-11 MED ORDER — PHENYLEPHRINE 80 MCG/ML (10ML) SYRINGE FOR IV PUSH (FOR BLOOD PRESSURE SUPPORT)
PREFILLED_SYRINGE | INTRAVENOUS | Status: DC | PRN
  Administered 2024-04-11: 120 ug via INTRAVENOUS

## 2024-04-11 MED ORDER — PROPOFOL 10 MG/ML IV BOLUS
INTRAVENOUS | Status: DC | PRN
  Administered 2024-04-11: 100 ug/kg/min via INTRAVENOUS
  Administered 2024-04-11: 20 mg via INTRAVENOUS
  Administered 2024-04-11: 60 mg via INTRAVENOUS
  Administered 2024-04-11: 20 mg via INTRAVENOUS

## 2024-04-11 MED ORDER — SODIUM CHLORIDE 0.9 % IV SOLN: INTRAVENOUS | Status: DC

## 2024-04-11 NOTE — Interval H&P Note (Signed)
 History and Physical Interval Note:  04/11/2024 10:00 AM  Heather Fitzpatrick  has presented today for surgery, with the diagnosis of AFIB.  The various methods of treatment have been discussed with the patient and family. After consideration of risks, benefits and other options for treatment, the patient has consented to  Procedures: TRANSESOPHAGEAL ECHOCARDIOGRAM (N/A) CARDIOVERSION (N/A) as a surgical intervention.  The patient's history has been reviewed, patient examined, no change in status, stable for surgery.  I have reviewed the patient's chart and labs.  Questions were answered to the patient's satisfaction.     [name], MD

## 2024-04-11 NOTE — Transfer of Care (Signed)
 Immediate Anesthesia Transfer of Care Note  Patient: Heather Fitzpatrick  Procedure(s) Performed: TRANSESOPHAGEAL ECHOCARDIOGRAM CARDIOVERSION  Patient Location: PACU and Cath Lab  Anesthesia Type:MAC  Level of Consciousness: drowsy  Airway & Oxygen Therapy: Patient Spontanous Breathing and Patient connected to nasal cannula oxygen  Post-op Assessment: Report given to RN and Post -op Vital signs reviewed and stable  Post vital signs: Reviewed and stable  Last Vitals:  Vitals Value Taken Time  BP    Temp 36.4 C 04/11/24 10:25  Pulse 74 04/11/24 10:27  Resp 24 04/11/24 10:27  SpO2 98 % 04/11/24 10:27  Vitals shown include unfiled device data.  Last Pain:  Vitals:   04/11/24 1025  TempSrc: Tympanic  PainSc: Asleep         Complications: No notable events documented.

## 2024-04-11 NOTE — Discharge Instructions (Signed)
TEE  YOU HAD AN CARDIAC PROCEDURE TODAY: Refer to the procedure report and other information in the discharge instructions given to you for any specific questions about what was found during the examination. If this information does not answer your questions, please call [facility] office at [phone] to clarify.   DIET: Your first meal following the procedure should be a light meal and then it is ok to progress to your normal diet. A half-sandwich or bowl of soup is an example of a good first meal. Heavy or fried foods are harder to digest and may make you feel nauseous or bloated. Drink plenty of fluids but you should avoid alcoholic beverages for 24 hours. If you had a esophageal dilation, please see attached instructions for diet.   ACTIVITY: Your care partner should take you home directly after the procedure. You should plan to take it easy, moving slowly for the rest of the day. You can resume normal activity the day after the procedure however YOU SHOULD NOT DRIVE, use power tools, machinery or perform tasks that involve climbing or major physical exertion for 24 hours (because of the sedation medicines used during the test).   SYMPTOMS TO REPORT IMMEDIATELY: A cardiologist can be reached at any hour. Please call [phone] for any of the following symptoms:  Vomiting of blood or coffee ground material  New, significant abdominal pain  New, significant chest pain or pain under the shoulder blades  Painful or persistently difficult swallowing  New shortness of breath  Black, tarry-looking or red, bloody stools  FOLLOW UP:  Please also call with any specific questions about appointments or follow up tests. Electrical Cardioversion Electrical cardioversion is the delivery of a jolt of electricity to restore a normal rhythm to the heart. A rhythm that is too fast or is not regular keeps the heart from pumping well. In this procedure, sticky patches or metal paddles are placed on the  chest to deliver electricity to the heart from a device. This procedure may be done in an emergency if: There is low or no blood pressure as a result of the heart rhythm. Normal rhythm must be restored as fast as possible to protect the brain and heart from further damage. It may save a life. This may also be a scheduled procedure for irregular or fast heart rhythms that are not immediately life-threatening.  What can I expect after the procedure? Your blood pressure, heart rate, breathing rate, and blood oxygen level will be monitored until you leave the hospital or clinic. Your heart rhythm will be watched to make sure it does not change. You may have some redness on the skin where the shocks were given. Over the counter cortizone cream may be helpful.  Follow these instructions at home: Do not drive for 24 hours if you were given a sedative during your procedure. Take over-the-counter and prescription medicines only as told by your health care provider. Ask your health care provider how to check your pulse. Check it often. Rest for 48 hours after the procedure or as told by your health care provider. Avoid or limit your caffeine use as told by your health care provider. Keep all follow-up visits as told by your health care provider. This is important. Contact a health care provider if: You feel like your heart is beating too quickly or your pulse is not regular. You have a serious muscle cramp that does not go away. Get help right away if: You have discomfort in your   chest. You are dizzy or you feel faint. You have trouble breathing or you are short of breath. Your speech is slurred. You have trouble moving an arm or leg on one side of your body. Your fingers or toes turn cold or blue. Summary Electrical cardioversion is the delivery of a jolt of electricity to restore a normal rhythm to the heart. This procedure may be done right away in an emergency or may be a scheduled procedure  if the condition is not an emergency. Generally, this is a safe procedure. After the procedure, check your pulse often as told by your health care provider. This information is not intended to replace advice given to you by your health care provider. Make sure you discuss any questions you have with your health care provider. Document Revised: 10/21/2018 Document Reviewed: 10/21/2018 Elsevier Patient Education  2020 [employer] 

## 2024-04-11 NOTE — Anesthesia Postprocedure Evaluation (Signed)
"   Anesthesia Post Note  Patient: Heather Fitzpatrick  Procedure(s) Performed: TRANSESOPHAGEAL ECHOCARDIOGRAM CARDIOVERSION     Patient location during evaluation: PACU Anesthesia Type: MAC Level of consciousness: awake and alert Pain management: pain level controlled Vital Signs Assessment: post-procedure vital signs reviewed and stable Respiratory status: spontaneous breathing, nonlabored ventilation, respiratory function stable and patient connected to nasal cannula oxygen Cardiovascular status: stable and blood pressure returned to baseline Postop Assessment: no apparent nausea or vomiting Anesthetic complications: no   No notable events documented.  Last Vitals:  Vitals:   04/11/24 1035 04/11/24 1045  BP: 119/75 115/78  Pulse: 74 78  Resp: 17 11  Temp:    SpO2: 96% 97%    Last Pain:  Vitals:   04/11/24 1045  TempSrc:   PainSc: 0-No pain                 Thom JONELLE Peoples      "

## 2024-04-11 NOTE — CV Procedure (Signed)
 Brief TEE Note  LVEF 50% No LA/LAA thrombus or masses.   Trivial TR, trivial MR. For additional details see full report.  Electrical Cardioversion Procedure Note DIEDRA SINOR [id number] [birth year]  Procedure: Electrical Cardioversion Indications:  Atrial Fibrillation  Procedure Details Consent: Risks of procedure as well as the alternatives and risks of each were explained to the (patient/caregiver).  Consent for procedure obtained. Time Out: Verified patient identification, verified procedure, site/side was marked, verified correct patient position, special equipment/implants available, medications/allergies/relevent history reviewed, required imaging and test results available.  Performed  Patient placed on cardiac monitor, pulse oximetry, supplemental oxygen as necessary.  Sedation given: propofol  Pacer pads placed anterior and posterior chest.  Cardioverted 1 time(s).  Cardioverted at 200J.  Evaluation Findings: Post procedure EKG shows: NSR Complications: None Patient did tolerate procedure well.   [name], MD 04/11/2024, 10:30 AM

## 2024-04-11 NOTE — Anesthesia Preprocedure Evaluation (Signed)
"                                    Anesthesia Evaluation  Patient identified by MRN, date of birth, ID band Patient awake    Reviewed: Allergy & Precautions, NPO status , Patient's Chart, lab work & pertinent test results  History of Anesthesia Complications Negative for: history of anesthetic complications  Airway Mallampati: II  TM Distance: >3 FB     Dental  (+) Dental Advisory Given, Caps   Pulmonary asthma    breath sounds clear to auscultation       Cardiovascular (-) angina + dysrhythmias Atrial Fibrillation  Rhythm:Irregular Rate:Normal     Neuro/Psych  PSYCHIATRIC DISORDERS  Depression    negative neurological ROS     GI/Hepatic negative GI ROS, Neg liver ROS,,,  Endo/Other  negative endocrine ROS    Renal/GU negative Renal ROS  negative genitourinary   Musculoskeletal  (+) Arthritis , Osteoarthritis,    Abdominal   Peds  Hematology Eliquis     Anesthesia Other Findings   Reproductive/Obstetrics                              Anesthesia Physical Anesthesia Plan  ASA: 3  Anesthesia Plan: General   Post-op Pain Management: Minimal or no pain anticipated   Induction: Intravenous  PONV Risk Score and Plan: 3 and Treatment may vary due to age or medical condition  Airway Management Planned: Mask, Nasal Cannula and Natural Airway  Additional Equipment: None  Intra-op Plan:   Post-operative Plan:   Informed Consent: I have reviewed the patients History and Physical, chart, labs and discussed the procedure including the risks, benefits and alternatives for the proposed anesthesia with the patient or authorized representative who has indicated his/her understanding and acceptance.     Dental advisory given  Plan Discussed with: CRNA  Anesthesia Plan Comments:          Anesthesia Quick Evaluation  "

## 2024-04-12 ENCOUNTER — Encounter (HOSPITAL_COMMUNITY): Payer: Self-pay | Admitting: Cardiovascular Disease

## 2024-04-25 ENCOUNTER — Ambulatory Visit (HOSPITAL_COMMUNITY)
Admission: RE | Admit: 2024-04-25 | Discharge: 2024-04-25 | Disposition: A | Source: Ambulatory Visit | Attending: Internal Medicine | Admitting: Internal Medicine

## 2024-04-25 ENCOUNTER — Other Ambulatory Visit (HOSPITAL_COMMUNITY): Payer: Self-pay | Admitting: *Deleted

## 2024-04-25 ENCOUNTER — Encounter (HOSPITAL_COMMUNITY): Payer: Self-pay | Admitting: Internal Medicine

## 2024-04-25 VITALS — BP 116/84 | HR 77 | Ht 65.0 in | Wt 178.6 lb

## 2024-04-25 DIAGNOSIS — I4819 Other persistent atrial fibrillation: Secondary | ICD-10-CM

## 2024-04-25 DIAGNOSIS — D6869 Other thrombophilia: Secondary | ICD-10-CM | POA: Diagnosis not present

## 2024-04-25 DIAGNOSIS — I4892 Unspecified atrial flutter: Secondary | ICD-10-CM

## 2024-04-25 MED ORDER — ELIQUIS 5 MG PO TABS: 5.0000 mg | ORAL_TABLET | Freq: Two times a day (BID) | ORAL | 6 refills | Status: AC

## 2024-04-25 MED ORDER — MULTAQ 400 MG PO TABS: 400.0000 mg | ORAL_TABLET | Freq: Two times a day (BID) | ORAL | 6 refills | Status: AC

## 2024-04-25 NOTE — Progress Notes (Signed)
 "   Primary Care Physician: Teresa Channel, MD Primary Cardiologist: None Electrophysiologist: None  Referring Physician: Dr. Teresa Aleck Heather Fitzpatrick is a 79 y.o. female with a history of asthma, HLD, osteopenia, PVCs, and atrial flutter who presents for follow up in the [facility]. Seen by PCP on 06/19/23 for recent URI (took prednisone) and found to be in new onset atrial flutter. Started on diltiazem  120 mg daily. Patient is on Eliquis  5 mg BID for a CHADS2VASC score of 3.  On follow up 07/09/23, she is currently in atrial flutter with RVR. We could not begin rate control at last visit due to low blood pressure. She notes BP at home runs typically right around 100 systolic but several readings are in the 90s. She began Eliquis  5 mg BID at last visit after discussion; no missed doses. She took two doses of Eliquis  on 3/20.   On follow up 08/01/23, she is currently in NSR. S/p successful DCCV on 07/16/23. No missed doses of Eliquis . She feels much better in normal rhythm.   Follow up 12/11/23. Patient returns for follow up for atrial flutter. She reports that she went out of rhythm on 12/08/23. There were no specific triggers that she could identify. She has symptoms of fatigue and heart racing.   Follow up 12/26/23. Patient is currently in NSR. Patient is s/p DCCV on 12/12/23. No missed doses of Eliquis .   Follow up 04/09/24. Patient is currently in atrial flutter with RVR.  Patient contacted office on 03/31/2024 noting she went back into A-fib and believes family stress may have been the trigger for this current episode.  She has been taking diltiazem  30 mg as needed.  She missed an entire day of Eliquis  on 12/27.  She feels tired and short of breath when out of rhythm.  Follow-up 04/25/2024.  Patient is currently in NSR.  S/p TEE/DCCV on 04/11/2024.  No missed doses of Eliquis .  She has successfully transitioned away from Lexapro and is doing well off medication.  She is not  currently taking a new mood stabilizing agent as she notes historically to take Lexapro seasonally.  Today, she  denies symptoms of chest pain, orthopnea, PND, lower extremity edema, dizziness, presyncope, syncope, snoring, daytime somnolence, bleeding, or neurologic sequela. The patient is tolerating medications without difficulties and is otherwise without complaint today.    she has a BMI of Body mass index is 29.72 kg/m.SABRA Filed Weights   04/25/24 0930  Weight: 81 kg      Current Outpatient Medications  Medication Sig Dispense Refill   acetaminophen  (TYLENOL ) 650 MG CR tablet Take 650 mg by mouth once a week.     carboxymethylcellulose (REFRESH PLUS) 0.5 % SOLN Place 1 drop into both eyes daily as needed (dry eyes).     Cholecalciferol (HM VITAMIN D3) 2000 units CAPS Take 4,000 Units by mouth daily.     Dihydroxyaluminum Sod Carb (ROLAIDS PO) Take 0.5 tablets by mouth daily as needed (GERD).     diltiazem  (CARDIZEM ) 30 MG tablet Take 1 tablet (30 mg total) by mouth 3 (three) times daily as needed. If HR and SBP above 100 15 tablet 3   dronedarone (MULTAQ) 400 MG tablet Take 1 tablet (400 mg total) by mouth 2 (two) times daily with a meal. 60 tablet 6   ELIQUIS  5 MG TABS tablet Take 1 tablet (5 mg total) by mouth 2 (two) times daily. 60 tablet 6   Multiple Vitamins-Minerals (  MULTIVITAMIN PO) Take 1 tablet by mouth daily.     PROAIR  HFA 108 (90 Base) MCG/ACT inhaler Inhale 1-2 puffs into the lungs every 4 (four) hours as needed for shortness of breath or wheezing.  1   sodium chloride  (OCEAN) 0.65 % SOLN nasal spray Place 1 spray into both nostrils daily as needed for congestion.     vitamin B-12 (CYANOCOBALAMIN) 500 MCG tablet Take 500 mcg by mouth daily.     No current facility-administered medications for this encounter.    Atrial Fibrillation Management history:  Previous antiarrhythmic drugs: none Previous cardioversions: 07/16/23, 12/12/23, 04/11/24 Previous ablations:  none Anticoagulation history: Eliquis  5 mg BID   ROS- All systems are reviewed and negative except as per the HPI above.  Physical Exam: BP 116/84   Pulse 77   Ht 5' 5 (1.651 m)   Wt 81 kg   BMI 29.72 kg/m   GEN- The patient is well appearing, alert and oriented x 3 today.   Neck - no JVD or carotid bruit noted Lungs- Clear to ausculation bilaterally, normal work of breathing Heart- Regular rate and rhythm, no murmurs, rubs or gallops, PMI not laterally displaced Extremities- no clubbing, cyanosis, or edema Skin - no rash or ecchymosis noted    EKG today demonstrates  EKG Interpretation Date/Time:  Friday April 25 2024 09:33:13 EST Ventricular Rate:  77 PR Interval:  188 QRS Duration:  90 QT Interval:  366 QTC Calculation: 414 R Axis:   37  Text Interpretation: Normal sinus rhythm Normal ECG When compared with ECG of 11-Apr-2024 11:01, No significant change was found Confirmed by Terra Pac ([id number]) on 04/25/2024 10:00:43 AM     Echo 07/31/23: 1. Left ventricular ejection fraction, by estimation, is 55 to 60%. Left  ventricular ejection fraction by 2D MOD biplane is 61.0 %. The left  ventricle has normal function. The left ventricle has no regional wall  motion abnormalities. Left ventricular diastolic parameters are consistent with Grade I diastolic dysfunction (impaired relaxation).   2. Right ventricular systolic function is normal. The right ventricular  size is normal. Tricuspid regurgitation signal is inadequate for assessing  PA pressure.   3. The mitral valve is normal in structure. No evidence of mitral valve  regurgitation. No evidence of mitral stenosis.   4. The aortic valve is tricuspid. Aortic valve regurgitation is not  visualized. No aortic stenosis is present.   5. The inferior vena cava is normal in size with greater than 50%  respiratory variability, suggesting right atrial pressure of 3 mmHg.    CHA2DS2-VASc Score = 3  The patient's score is  based upon: CHF History: 0 HTN History: 0 Diabetes History: 0 Stroke History: 0 Vascular Disease History: 0 Age Score: 2 Gender Score: 1       ASSESSMENT AND PLAN: Atrial flutter The patient's CHA2DS2-VASc score is 3, indicating a 3.2% annual risk of stroke.   S/p DCCV on 12/12/23. S/p TEE/DCCV on 04/11/24.  Patient is currently in NSR.  We discussed rhythm control options as previously noted and patient would like to begin Multaq for AAD therapy.  Review of medication list does not show any significant interactions at this time.  Patient will begin Multaq 400 mg twice daily with food and we will recheck an ECG in 7-10 days.  We did discuss ablation as a procedure for rhythm control but patient would like to read about the procedure more before deciding to go through with this.  She would consider it  as a possible option in the future.  Secondary Hypercoagulable State (ICD10:  D68.69) The patient is at significant risk for stroke/thromboembolism based upon her CHA2DS2-VASc Score of 3.  Continue Apixaban  (Eliquis ).  No missed doses of Eliquis .       Follow up 7-10 days for repeat ECG.    Dorn Heinrich, PA-C [facility] [address] [place], [place] [address] [phone]  "

## 2024-05-01 ENCOUNTER — Ambulatory Visit (HOSPITAL_COMMUNITY): Admitting: Internal Medicine

## 2024-05-15 ENCOUNTER — Ambulatory Visit (HOSPITAL_COMMUNITY): Admitting: Internal Medicine

## 2024-06-24 ENCOUNTER — Ambulatory Visit (HOSPITAL_COMMUNITY): Admitting: Internal Medicine
